# Patient Record
Sex: Male | Born: 1965
Health system: Southern US, Community
[De-identification: ages and names within clinical notes are randomized; demographics above are authoritative.]

## PROBLEM LIST (undated history)

## (undated) DIAGNOSIS — R7611 Nonspecific reaction to tuberculin skin test without active tuberculosis: Secondary | ICD-10-CM

## (undated) DIAGNOSIS — T783XXA Angioneurotic edema, initial encounter: Secondary | ICD-10-CM

## (undated) DIAGNOSIS — E119 Type 2 diabetes mellitus without complications: Secondary | ICD-10-CM

## (undated) DIAGNOSIS — L509 Urticaria, unspecified: Secondary | ICD-10-CM

## (undated) DIAGNOSIS — E785 Hyperlipidemia, unspecified: Secondary | ICD-10-CM

## (undated) DIAGNOSIS — K219 Gastro-esophageal reflux disease without esophagitis: Secondary | ICD-10-CM

## (undated) DIAGNOSIS — IMO0001 Reserved for inherently not codable concepts without codable children: Secondary | ICD-10-CM

## (undated) HISTORY — DX: Type 2 diabetes mellitus without complications: E11.9

## (undated) HISTORY — DX: Urticaria, unspecified: L50.9

## (undated) HISTORY — DX: Gastro-esophageal reflux disease without esophagitis: K21.9

## (undated) HISTORY — DX: Angioneurotic edema, initial encounter: T78.3XXA

## (undated) HISTORY — DX: Hyperlipidemia, unspecified: E78.5

## (undated) HISTORY — DX: Reserved for inherently not codable concepts without codable children: IMO0001

## (undated) HISTORY — PX: NO PAST SURGERIES: SHX2092

## (undated) HISTORY — DX: Nonspecific reaction to tuberculin skin test without active tuberculosis: R76.11

---

## 1997-07-02 DIAGNOSIS — R7611 Nonspecific reaction to tuberculin skin test without active tuberculosis: Secondary | ICD-10-CM

## 1997-07-02 HISTORY — DX: Nonspecific reaction to tuberculin skin test without active tuberculosis: R76.11

## 1999-07-03 DIAGNOSIS — Z111 Encounter for screening for respiratory tuberculosis: Secondary | ICD-10-CM

## 1999-07-03 DIAGNOSIS — IMO0001 Reserved for inherently not codable concepts without codable children: Secondary | ICD-10-CM

## 1999-07-03 HISTORY — DX: Reserved for inherently not codable concepts without codable children: IMO0001

## 1999-07-03 HISTORY — DX: Encounter for screening for respiratory tuberculosis: Z11.1

## 2000-03-08 ENCOUNTER — Encounter: Payer: Self-pay | Admitting: Family Medicine

## 2000-03-08 ENCOUNTER — Ambulatory Visit (HOSPITAL_COMMUNITY): Admission: RE | Admit: 2000-03-08 | Discharge: 2000-03-08 | Payer: Self-pay | Admitting: Family Medicine

## 2002-09-20 ENCOUNTER — Other Ambulatory Visit: Payer: Self-pay

## 2002-09-20 ENCOUNTER — Other Ambulatory Visit (HOSPITAL_BASED_OUTPATIENT_CLINIC_OR_DEPARTMENT_OTHER): Payer: Self-pay | Admitting: Anesthesiology

## 2002-09-21 ENCOUNTER — Other Ambulatory Visit: Payer: Self-pay

## 2002-09-21 ENCOUNTER — Other Ambulatory Visit: Payer: Self-pay | Admitting: Emergency Medicine

## 2002-09-21 ENCOUNTER — Other Ambulatory Visit: Payer: Self-pay | Admitting: Adult Reconstructive Orthopaedic Surgery

## 2005-10-20 ENCOUNTER — Emergency Department (HOSPITAL_COMMUNITY): Admission: EM | Admit: 2005-10-20 | Discharge: 2005-10-20 | Payer: Self-pay | Admitting: Emergency Medicine

## 2006-03-01 ENCOUNTER — Ambulatory Visit: Payer: Self-pay | Admitting: Internal Medicine

## 2007-02-14 ENCOUNTER — Ambulatory Visit: Payer: Self-pay | Admitting: Internal Medicine

## 2008-05-25 ENCOUNTER — Ambulatory Visit: Payer: Self-pay | Admitting: Internal Medicine

## 2008-05-25 DIAGNOSIS — K219 Gastro-esophageal reflux disease without esophagitis: Secondary | ICD-10-CM | POA: Insufficient documentation

## 2008-05-26 LAB — CONVERTED CEMR LAB
ALT: 24 units/L (ref 0–53)
AST: 21 units/L (ref 0–37)
BUN: 13 mg/dL (ref 6–23)
Basophils Absolute: 0.1 10*3/uL (ref 0.0–0.1)
Basophils Relative: 0.9 % (ref 0.0–3.0)
CO2: 30 meq/L (ref 19–32)
Calcium: 9.5 mg/dL (ref 8.4–10.5)
Chloride: 105 meq/L (ref 96–112)
Cholesterol: 159 mg/dL (ref 0–200)
Creatinine, Ser: 1 mg/dL (ref 0.4–1.5)
Eosinophils Absolute: 0.4 10*3/uL (ref 0.0–0.7)
Eosinophils Relative: 4.7 % (ref 0.0–5.0)
GFR calc Af Amer: 106 mL/min
GFR calc non Af Amer: 88 mL/min
Glucose, Bld: 94 mg/dL (ref 70–99)
HCT: 47.4 % (ref 39.0–52.0)
HDL: 36 mg/dL — ABNORMAL LOW (ref >39.0)
Hemoglobin: 16 g/dL (ref 13.0–17.0)
LDL Cholesterol: 92 mg/dL (ref 0–99)
Lymphocytes Relative: 29.9 % (ref 12.0–46.0)
MCHC: 33.8 g/dL (ref 30.0–36.0)
MCV: 85.8 fL (ref 78.0–100.0)
Monocytes Absolute: 0.6 10*3/uL (ref 0.1–1.0)
Monocytes Relative: 7.4 % (ref 3.0–12.0)
Neutro Abs: 4.4 10*3/uL (ref 1.4–7.7)
Neutrophils Relative %: 57.1 % (ref 43.0–77.0)
Platelets: 178 10*3/uL (ref 150–400)
Potassium: 4.2 meq/L (ref 3.5–5.1)
RBC: 5.52 M/uL (ref 4.22–5.81)
RDW: 13.4 % (ref 11.5–14.6)
Sodium: 140 meq/L (ref 135–145)
TSH: 3.36 microintl units/mL (ref 0.35–5.50)
Total CHOL/HDL Ratio: 4.4
Triglycerides: 154 mg/dL — ABNORMAL HIGH (ref 0–149)
VLDL: 31 mg/dL (ref 0–40)
WBC: 7.8 10*3/uL (ref 4.5–10.5)

## 2009-09-27 ENCOUNTER — Ambulatory Visit: Payer: Self-pay | Admitting: Internal Medicine

## 2009-09-27 DIAGNOSIS — D179 Benign lipomatous neoplasm, unspecified: Secondary | ICD-10-CM | POA: Insufficient documentation

## 2009-10-06 LAB — CONVERTED CEMR LAB
ALT: 22 units/L (ref 0–53)
AST: 20 units/L (ref 0–37)
Albumin: 4.2 g/dL (ref 3.5–5.2)
Alkaline Phosphatase: 64 units/L (ref 39–117)
BUN: 16 mg/dL (ref 6–23)
Basophils Absolute: 0 10*3/uL (ref 0.0–0.1)
Basophils Relative: 0.2 % (ref 0.0–3.0)
Bilirubin, Direct: 0 mg/dL (ref 0.0–0.3)
CO2: 30 meq/L (ref 19–32)
Calcium: 9.4 mg/dL (ref 8.4–10.5)
Chloride: 105 meq/L (ref 96–112)
Cholesterol: 160 mg/dL (ref 0–200)
Creatinine, Ser: 1.1 mg/dL (ref 0.4–1.5)
Eosinophils Absolute: 0.2 10*3/uL (ref 0.0–0.7)
Eosinophils Relative: 2.6 % (ref 0.0–5.0)
GFR calc non Af Amer: 77.97 mL/min (ref >60)
Glucose, Bld: 93 mg/dL (ref 70–99)
HCT: 46.9 % (ref 39.0–52.0)
HDL: 43.9 mg/dL (ref >39.00)
Hemoglobin: 15.5 g/dL (ref 13.0–17.0)
LDL Cholesterol: 95 mg/dL (ref 0–99)
Lymphocytes Relative: 31.4 % (ref 12.0–46.0)
Lymphs Abs: 2.4 10*3/uL (ref 0.7–4.0)
MCHC: 33.1 g/dL (ref 30.0–36.0)
MCV: 85.8 fL (ref 78.0–100.0)
Monocytes Absolute: 0.5 10*3/uL (ref 0.1–1.0)
Monocytes Relative: 7.1 % (ref 3.0–12.0)
Neutro Abs: 4.5 10*3/uL (ref 1.4–7.7)
Neutrophils Relative %: 58.7 % (ref 43.0–77.0)
Platelets: 182 10*3/uL (ref 150.0–400.0)
Potassium: 4.8 meq/L (ref 3.5–5.1)
RBC: 5.47 M/uL (ref 4.22–5.81)
RDW: 14.2 % (ref 11.5–14.6)
Sodium: 144 meq/L (ref 135–145)
TSH: 4.07 microintl units/mL (ref 0.35–5.50)
Total Bilirubin: 0.6 mg/dL (ref 0.3–1.2)
Total CHOL/HDL Ratio: 4
Total Protein: 8.3 g/dL (ref 6.0–8.3)
Triglycerides: 105 mg/dL (ref 0.0–149.0)
VLDL: 21 mg/dL (ref 0.0–40.0)
WBC: 7.6 10*3/uL (ref 4.5–10.5)

## 2010-08-01 NOTE — Assessment & Plan Note (Signed)
Summary: cpx- jr   Vital Signs:  Patient profile:   45 year old male Height:      69.25 inches Weight:      209 pounds BMI:     30.75 Pulse rate:   66 / minute BP sitting:   120 / 70  Vitals Entered By: Shary Decamp (September 27, 2009 8:54 AM)  History of Present Illness: CPX occasionally has right knee pain when she stands up, no swelling, redness.  Preventive Screening-Counseling & Management  Caffeine-Diet-Exercise     Caffeine use/day: 0     Does Patient Exercise: no      Drug Use:  no.    Current Medications (verified): 1)  Omeprazole 20 Mg Tbec (Omeprazole) .... Once Daily As Needed 2)  Zantac 75 75 Mg Tabs (Ranitidine Hcl) .... Alternates With Omeprazole  Allergies (verified): No Known Drug Allergies  Past History:  Past Medical History: +PPD 1999 INH x 6 months neg PPD 2001 GERD  Past Surgical History: Reviewed history from 05/25/2008 and no changes required. no  Family History: Reviewed history from 05/25/2008 and no changes required. DM - no colon Ca - no prostate Ca - no HTN - no CAD - no stroke - no  Social History: Married from [place] 1 adopted children tobacco--no ETOH--no exercise-- active at work diet-- healthy most of the time Caffeine use/day:  0 Drug Use:  no Does Patient Exercise:  no  Review of Systems CV:  Denies chest pain or discomfort and swelling of feet. Resp:  Denies cough and shortness of breath. GI:  Denies bloody stools, diarrhea, nausea, and vomiting; GERD-- uses zantac or prilosec as needed . GU:  Denies dysuria, hematuria, urinary frequency, and urinary hesitancy.  Physical Exam  General:  alert, well-developed, and overweight-appearing.   Neck:  no masses and no thyromegaly.   Lungs:  normal respiratory effort, no intercostal retractions, no accessory muscle use, and normal breath sounds.   Heart:  normal rate, regular rhythm, no murmur, and no gallop.   Abdomen:  soft, non-tender, no distention, no masses,  no hepatomegaly, and no splenomegaly.  at the left flank has a 1x 0.8-inch s.q. mass consistent with a lipoma Extremities:  no edema knees are symmetric, no swelling, redness, ligaments stable   Impression & Recommendations:  Problem # 1:  HEALTH SCREENING (ICD-V70.0)  Td 09 cont healthy life style , exercise more     Orders: Venipuncture (16109) TLB-BMP (Basic Metabolic Panel-BMET) (80048-METABOL) TLB-CBC Platelet - w/Differential (85025-CBCD) TLB-Hepatic/Liver Function Pnl (80076-HEPATIC) TLB-Lipid Panel (80061-LIPID) TLB-TSH (Thyroid Stimulating Hormone) (84443-TSH)  Problem # 2:  LIPOMAS, MULTIPLE (ICD-214.9) history of multiple lipomas, status post evaluation by surgery, was recommend observation versus excision.  Patient elected observation.  Problem # 3:  GERD (ICD-530.81) symptoms mostly related to spicy foods, patient managing  his symptoms well with omeprazole or Zantac as needed His updated medication list for this problem includes:    Omeprazole 20 Mg Tbec (Omeprazole) ..... Once daily as needed    Zantac 75 75 Mg Tabs (Ranitidine hcl) .Marland Kitchen... Alternates with omeprazole  Complete Medication List: 1)  Omeprazole 20 Mg Tbec (Omeprazole) .... Once daily as needed 2)  Zantac 75 75 Mg Tabs (Ranitidine hcl) .... Alternates with omeprazole  Patient Instructions: 1)  Please schedule a follow-up appointment in 1 year.    Risk Factors:  Tobacco use:  never Drug use:  no Caffeine use:  0 drinks per day Alcohol use:  no Exercise:  no    Preventive  Care Screening  Prior Values:    Last Tetanus Booster:  Tdap (05/25/2008)

## 2010-11-21 ENCOUNTER — Ambulatory Visit (INDEPENDENT_AMBULATORY_CARE_PROVIDER_SITE_OTHER): Payer: Managed Care, Other (non HMO) | Admitting: Internal Medicine

## 2010-11-21 ENCOUNTER — Encounter: Payer: Self-pay | Admitting: Internal Medicine

## 2010-11-21 DIAGNOSIS — K219 Gastro-esophageal reflux disease without esophagitis: Secondary | ICD-10-CM

## 2010-11-21 DIAGNOSIS — D179 Benign lipomatous neoplasm, unspecified: Secondary | ICD-10-CM

## 2010-11-21 DIAGNOSIS — Z0000 Encounter for general adult medical examination without abnormal findings: Secondary | ICD-10-CM

## 2010-11-21 LAB — CBC WITH DIFFERENTIAL/PLATELET
Basophils Absolute: 0 10*3/uL (ref 0.0–0.1)
Basophils Relative: 0.2 % (ref 0.0–3.0)
Eosinophils Absolute: 0.2 10*3/uL (ref 0.0–0.7)
Eosinophils Relative: 3.1 % (ref 0.0–5.0)
HCT: 48.9 % (ref 39.0–52.0)
Hemoglobin: 16.5 g/dL (ref 13.0–17.0)
Lymphocytes Relative: 30.3 % (ref 12.0–46.0)
Lymphs Abs: 2.2 10*3/uL (ref 0.7–4.0)
MCHC: 33.7 g/dL (ref 30.0–36.0)
MCV: 85.3 fl (ref 78.0–100.0)
Monocytes Absolute: 0.5 10*3/uL (ref 0.1–1.0)
Monocytes Relative: 7 % (ref 3.0–12.0)
Neutro Abs: 4.4 10*3/uL (ref 1.4–7.7)
Neutrophils Relative %: 59.4 % (ref 43.0–77.0)
Platelets: 193 10*3/uL (ref 150.0–400.0)
RBC: 5.73 Mil/uL (ref 4.22–5.81)
RDW: 14.3 % (ref 11.5–14.6)
WBC: 7.4 10*3/uL (ref 4.5–10.5)

## 2010-11-21 LAB — BASIC METABOLIC PANEL
BUN: 16 mg/dL (ref 6–23)
CO2: 30 mEq/L (ref 19–32)
Calcium: 9.8 mg/dL (ref 8.4–10.5)
Chloride: 104 mEq/L (ref 96–112)
Creatinine, Ser: 1 mg/dL (ref 0.4–1.5)
GFR: 82.73 mL/min (ref >60.00)
Glucose, Bld: 97 mg/dL (ref 70–99)
Potassium: 4.8 mEq/L (ref 3.5–5.1)
Sodium: 141 mEq/L (ref 135–145)

## 2010-11-21 LAB — LIPID PANEL
Cholesterol: 182 mg/dL (ref 0–200)
HDL: 49.5 mg/dL (ref >39.00)
LDL Cholesterol: 109 mg/dL — ABNORMAL HIGH (ref 0–99)
Total CHOL/HDL Ratio: 4
Triglycerides: 119 mg/dL (ref 0.0–149.0)
VLDL: 23.8 mg/dL (ref 0.0–40.0)

## 2010-11-21 NOTE — Assessment & Plan Note (Signed)
Was seen by surgery before, they rec observation. Reports no change in size of lipomas

## 2010-11-21 NOTE — Assessment & Plan Note (Addendum)
Update on shots Diet-exercise discussed. Occ low back pain: stretching recommended  Labs

## 2010-11-21 NOTE — Assessment & Plan Note (Signed)
Well controlled w/ meds prn

## 2010-11-21 NOTE — Progress Notes (Signed)
  Subjective:    Patient ID: Bryan Shaw, male    DOB: 07-Jul-1965, 45 y.o.   MRN: [id number]  HPI Complete physical exam, no major concerns.  Past Medical History  Diagnosis Date  . Positive PPD 1999    INH x 6 months  . PPD negative 2001  . GERD (gastroesophageal reflux disease)    No past surgical history on file.  Family History: DM - no colon Ca - no prostate Ca - no HTN - no CAD - no stroke - no  Social History: Married from [place] 1 adopted children tobacco--no ETOH--no exercise-- active at work, no routine exercise  diet-- healthy most of the time  Drug Use:  no    Review of Systems Denies chest pain or shortness of breath. GERD symptoms well controlled with either PPIs or Zantac as needed. Denies abdominal pain, blood in the stools, diarrhea. No dysuria or gross hematuria. Occasional has back pain, mild symptoms.    Objective:   Physical Exam  Constitutional: He is oriented to person, place, and time. He appears well-developed and well-nourished. No distress.  HENT:  Head: Normocephalic and atraumatic.  Eyes: No scleral icterus.  Neck: No thyromegaly present.  Cardiovascular: Normal rate, regular rhythm and normal heart sounds.   No murmur heard. Pulmonary/Chest: Effort normal and breath sounds normal. No respiratory distress. He has no wheezes. He has no rales.  Abdominal: Soft. Bowel sounds are normal. He exhibits no distension. There is no tenderness. There is no rebound and no guarding.  Musculoskeletal: He exhibits no edema.  Neurological: He is alert and oriented to person, place, and time.  Skin: Skin is warm and dry. He is not diaphoretic.  Psychiatric: He has a normal mood and affect. His behavior is normal. Judgment and thought content normal.       Assessment & Plan:

## 2010-11-23 ENCOUNTER — Telehealth: Payer: Self-pay | Admitting: *Deleted

## 2010-11-23 NOTE — Telephone Encounter (Signed)
Message copied by Army Fossa on Thu Nov 23, 2010  8:55 AM ------      Message from: Bryan Shaw      Created: Wed Nov 22, 2010  5:13 PM       Advise patient,      All labs normal, and good cholesterol levels !

## 2010-11-23 NOTE — Telephone Encounter (Signed)
Message left for patient to return my call.  

## 2010-11-24 NOTE — Telephone Encounter (Signed)
Message left for patient to return my call.  

## 2010-11-28 NOTE — Telephone Encounter (Signed)
Message left for patient to return my call.  

## 2010-11-29 NOTE — Telephone Encounter (Signed)
Pt is aware.  

## 2010-11-29 NOTE — Telephone Encounter (Signed)
Please call patient on his cell at [phone]

## 2012-03-27 ENCOUNTER — Ambulatory Visit (INDEPENDENT_AMBULATORY_CARE_PROVIDER_SITE_OTHER): Payer: Managed Care, Other (non HMO) | Admitting: Internal Medicine

## 2012-03-27 ENCOUNTER — Encounter: Payer: Self-pay | Admitting: Internal Medicine

## 2012-03-27 VITALS — BP 142/86 | HR 54 | Temp 98.0°F | Ht 68.5 in | Wt 211.0 lb

## 2012-03-27 DIAGNOSIS — Z0000 Encounter for general adult medical examination without abnormal findings: Secondary | ICD-10-CM

## 2012-03-27 DIAGNOSIS — Z23 Encounter for immunization: Secondary | ICD-10-CM

## 2012-03-27 DIAGNOSIS — K219 Gastro-esophageal reflux disease without esophagitis: Secondary | ICD-10-CM

## 2012-03-27 LAB — HEPATIC FUNCTION PANEL
AST: 24 U/L (ref 0–37)
Total Bilirubin: 0.5 mg/dL (ref 0.3–1.2)

## 2012-03-27 LAB — TSH: TSH: 2.2 u[IU]/mL (ref 0.35–5.50)

## 2012-03-27 LAB — LIPID PANEL
Total CHOL/HDL Ratio: 4
VLDL: 23.4 mg/dL (ref 0.0–40.0)

## 2012-03-27 NOTE — Assessment & Plan Note (Addendum)
Td 2009 Flu shot today EKG  sinus bradycardia otherwise normal Diet- discussed.  Is exercising twice a week,praised   Labs

## 2012-03-27 NOTE — Progress Notes (Signed)
  Subjective:    Patient ID: Bryan Shaw, male    DOB: [birth year], 46 y.o.   MRN: [id number]  HPI CPX  Past medical history Positive PPD, 1999, status post antibiotics Negative PPD 2001 GERD  Past surgical history Tooth extractions  Family History: DM - sister (borderline) colon Ca - no prostate Ca - no HTN - no CAD - no stroke - no  Social History: Married, from [place], 1 adopted children tobacco--no ETOH--no exercise-- active at work, goes to the [employer]-- regular   Drug Use:  no    Review of Systems Currently, finishing a round of penicillin prescribed by his dentist. 3 days history of nasal congestion, denies fever chills or sputum production. He does have mild cough. No chest pain shortness or breath No palpitations No nausea, vomiting, diarrhea or blood in the stools. No dysuria, gross hematuria and difficulty urinating     Objective:   Physical Exam General -- alert, well-developed, and well-nourished.   Neck --no thyromegaly , normal carotid pulse Lungs -- normal respiratory effort, no intercostal retractions, no accessory muscle use, and normal breath sounds.   Heart-- normal rate, regular rhythm, no murmur, and no gallop.   Abdomen--soft, non-tender, no distention, no masses, no HSM, no guarding, and no rigidity.   Extremities-- no pretibial edema bilaterally Neurologic-- alert & oriented X3 and strength normal in all extremities. Psych-- Cognition and judgment appear intact. Alert and cooperative with normal attention span and concentration.  not anxious appearing and not depressed appearing.       Assessment & Plan:

## 2012-03-27 NOTE — Assessment & Plan Note (Signed)
Needs  PPIs only occasionally, has changed his diet (less spicy food), doing well

## 2012-04-01 ENCOUNTER — Encounter: Payer: Self-pay | Admitting: *Deleted

## 2012-11-20 ENCOUNTER — Ambulatory Visit: Payer: Managed Care, Other (non HMO)

## 2012-11-20 ENCOUNTER — Ambulatory Visit (INDEPENDENT_AMBULATORY_CARE_PROVIDER_SITE_OTHER): Payer: Managed Care, Other (non HMO) | Admitting: Family Medicine

## 2012-11-20 VITALS — BP 134/85 | HR 62 | Temp 98.3°F | Resp 16 | Ht 69.5 in | Wt 212.4 lb

## 2012-11-20 DIAGNOSIS — R0989 Other specified symptoms and signs involving the circulatory and respiratory systems: Secondary | ICD-10-CM

## 2012-11-20 DIAGNOSIS — R1310 Dysphagia, unspecified: Secondary | ICD-10-CM

## 2012-11-20 DIAGNOSIS — R198 Other specified symptoms and signs involving the digestive system and abdomen: Secondary | ICD-10-CM

## 2012-11-20 DIAGNOSIS — Z1329 Encounter for screening for other suspected endocrine disorder: Secondary | ICD-10-CM

## 2012-11-20 DIAGNOSIS — R09A2 Foreign body sensation, throat: Secondary | ICD-10-CM

## 2012-11-20 DIAGNOSIS — F449 Dissociative and conversion disorder, unspecified: Secondary | ICD-10-CM

## 2012-11-20 NOTE — Progress Notes (Signed)
  Subjective:    Patient ID: Bryan Shaw, male    DOB: 04-06-1966, 47 y.o.   MRN: [id number]  HPI 47 year old pleasant male presents with 4 day history of "feeling like there is a lump in his throat." Symptoms started suddenly 4 days ago after eating soup and rice for dinner.  Since then every time he swallows he has a slight discomfort and full sensation in his throat. Does not have any upper sore throat or pain in his oropharynx.  Is concerned about his thyroid.  Denies any hair/nail changes, unexplained weight gain or loss, skin changes, fever, chills, nausea, or vomiting. He overall feels well. Had a CPE last year. No history of cardiac disease. Does have hx of GERD treated with Zantac 150 mg prn which does seem to help.  Admits he has a lot of peppers and spicy food in his diet.  Does not admit to any increase in GERD symptoms prior to onset of this sensation.      Review of Systems  Constitutional: Negative for fever, chills, activity change, appetite change and unexpected weight change.  HENT: Negative for sore throat, trouble swallowing and neck pain.   Cardiovascular: Negative for chest pain.  Gastrointestinal: Negative for nausea and vomiting.  Endocrine: Negative for cold intolerance and heat intolerance.       Objective:   Physical Exam  Constitutional: He is oriented to person, place, and time. He appears well-developed and well-nourished.  HENT:  Head: Normocephalic and atraumatic.  Right Ear: Hearing, tympanic membrane, external ear and ear canal normal.  Left Ear: Hearing, tympanic membrane, external ear and ear canal normal.  Mouth/Throat: Uvula is midline, oropharynx is clear and moist and mucous membranes are normal.  Eyes: Conjunctivae are normal.  Neck: Normal range of motion. No thyromegaly present.  Cardiovascular: Normal rate, regular rhythm and normal heart sounds.   Pulmonary/Chest: Effort normal and breath sounds normal.  Lymphadenopathy:    He has no  cervical adenopathy.  Neurological: He is alert and oriented to person, place, and time.  Psychiatric: He has a normal mood and affect. His behavior is normal. Judgment and thought content normal.     [facility] reading (PRIMARY) by  Dr. Neva Seat as normal soft tissue neck.       Assessment & Plan:  Odynophagia - Plan: DG Neck Soft Tissue  Globus pharyngeus - Plan: DG Neck Soft Tissue  Screening for thyroid disorder - Plan: TSH  Labs pending Normal soft tissue neck today Recommend start Zantac 150 mg daily x 1 week Maalox qhs x 3-4 days Let me know if symptoms worsen or fail to improve. If symptoms do not seem to be getting better over the next 3-4 days, consider ENT referral for further evaluation and treatment.

## 2012-11-20 NOTE — Progress Notes (Signed)
Xray read and patient discussed with Ms Renato Gails. Agree with assessment and plan of care per her note.  Radiology report reviewed:  *RADIOLOGY REPORT*  Clinical Data: Odynophagia  NECK SOFT TISSUES - 1+ VIEW  Comparison: None.  Findings: Normal lateral neck soft tissues.  No prevertebral soft tissue swelling.  Mild degenerative changes of the cervical spine.  IMPRESSION:  Unremarkable lateral soft tissue neck.

## 2012-11-21 LAB — TSH: TSH: 3.38 u[IU]/mL (ref 0.350–4.500)

## 2013-04-01 ENCOUNTER — Telehealth: Payer: Self-pay

## 2013-04-01 NOTE — Telephone Encounter (Signed)
LM for CB HM not UTD-needs flu vaccine Any information from previous providers?

## 2013-04-02 ENCOUNTER — Ambulatory Visit (INDEPENDENT_AMBULATORY_CARE_PROVIDER_SITE_OTHER): Payer: Managed Care, Other (non HMO) | Admitting: Internal Medicine

## 2013-04-02 ENCOUNTER — Encounter: Payer: Self-pay | Admitting: Internal Medicine

## 2013-04-02 VITALS — BP 129/82 | HR 64 | Temp 98.5°F | Ht 70.2 in | Wt 217.6 lb

## 2013-04-02 DIAGNOSIS — K219 Gastro-esophageal reflux disease without esophagitis: Secondary | ICD-10-CM

## 2013-04-02 DIAGNOSIS — Z23 Encounter for immunization: Secondary | ICD-10-CM

## 2013-04-02 DIAGNOSIS — Z0000 Encounter for general adult medical examination without abnormal findings: Secondary | ICD-10-CM

## 2013-04-02 LAB — LIPID PANEL
Cholesterol: 174 mg/dL (ref 0–200)
HDL: 42.9 mg/dL (ref >39.00)
VLDL: 28.4 mg/dL (ref 0.0–40.0)

## 2013-04-02 LAB — COMPREHENSIVE METABOLIC PANEL
ALT: 32 U/L (ref 0–53)
Albumin: 4.3 g/dL (ref 3.5–5.2)
CO2: 26 mEq/L (ref 19–32)
Calcium: 9.1 mg/dL (ref 8.4–10.5)
Chloride: 105 mEq/L (ref 96–112)
Creatinine, Ser: 1.1 mg/dL (ref 0.4–1.5)
GFR: 78.35 mL/min (ref >60.00)
Potassium: 4.2 mEq/L (ref 3.5–5.1)
Sodium: 137 mEq/L (ref 135–145)
Total Protein: 7.6 g/dL (ref 6.0–8.3)

## 2013-04-02 LAB — CBC WITH DIFFERENTIAL/PLATELET
Basophils Absolute: 0 10*3/uL (ref 0.0–0.1)
Eosinophils Absolute: 0.3 10*3/uL (ref 0.0–0.7)
Lymphocytes Relative: 30.5 % (ref 12.0–46.0)
Monocytes Relative: 7.7 % (ref 3.0–12.0)
Neutrophils Relative %: 58.1 % (ref 43.0–77.0)
Platelets: 199 10*3/uL (ref 150.0–400.0)
RDW: 14.6 % (ref 11.5–14.6)

## 2013-04-02 NOTE — Assessment & Plan Note (Signed)
Well-controlled with occasional use of omeprazole or Zantac

## 2013-04-02 NOTE — Telephone Encounter (Signed)
Unable to reach Pre-Visit.   

## 2013-04-02 NOTE — Assessment & Plan Note (Addendum)
Td 09 flu shot today Has gain some weight but feels well Diet-exercise discussed  Labs

## 2013-04-02 NOTE — Progress Notes (Signed)
  Subjective:    Patient ID: Bryan Shaw, male    DOB: 07-16-1965, 47 y.o.   MRN: [id number]  HPI CPX  Past Medical History  Diagnosis Date  . Positive PPD 1999    INH x 6 months  . PPD negative 2001  . GERD (gastroesophageal reflux disease)    Past Surgical History  Procedure Laterality Date  . No past surgeries     History   Social History  . Marital Status: Married    Spouse Name: N/A    Number of Children: 1  . Years of Education: N/A   Occupational History  . furniture sales    Social History Main Topics  . Smoking status: Never Smoker   . Smokeless tobacco: Never Used  . Alcohol Use: No  . Drug Use: No  . Sexual Activity: Yes    Birth Control/ Protection: None   Other Topics Concern  . Not on file   Social History Narrative   From [place]   1 adopted children       Family History  Problem Relation Age of Onset  . Diabetes Neg Hx   . Colon cancer Neg Hx   . Hypertension Neg Hx   . Coronary artery disease Neg Hx   . Stroke Neg Hx     Review of Systems Diet--  Regular, healthy Exercise-- gym x 2-3/week No  CP, SOB, lower extremity edema Denies  nausea, vomiting diarrhea Denies  blood in the stools (-) cough, sputum production No dysuria, gross hematuria, difficulty urinating   No anxiety, depression    Objective:   Physical Exam BP 129/82  Pulse 64  Temp(Src) 98.5 F (36.9 C)  Ht 5' 10.2" (1.783 m)  Wt 217 lb 9.6 oz (98.703 kg)  BMI 31.05 kg/m2  SpO2 100% General -- alert, well-developed, NAD.  Neck --no thyromegaly Lungs -- normal respiratory effort, no intercostal retractions, no accessory muscle use, and normal breath sounds.  Heart-- normal rate, regular rhythm, no murmur.  Abdomen-- Not distended, good bowel sounds,soft, non-tender. Extremities-- no pretibial edema bilaterally  Neurologic--  alert & oriented X3. Speech normal, gait normal, strength normal in all extremities.   Psych-- Cognition and judgment appear intact.  Cooperative with normal attention span and concentration. No anxious appearing , no depressed appearing.      Assessment & Plan:

## 2013-04-06 ENCOUNTER — Encounter: Payer: Self-pay | Admitting: *Deleted

## 2014-06-03 ENCOUNTER — Encounter: Payer: Self-pay | Admitting: Internal Medicine

## 2014-06-09 ENCOUNTER — Encounter: Payer: Self-pay | Admitting: Internal Medicine

## 2014-06-09 ENCOUNTER — Ambulatory Visit (INDEPENDENT_AMBULATORY_CARE_PROVIDER_SITE_OTHER): Payer: 59 | Admitting: Internal Medicine

## 2014-06-09 ENCOUNTER — Ambulatory Visit (INDEPENDENT_AMBULATORY_CARE_PROVIDER_SITE_OTHER): Payer: 59

## 2014-06-09 VITALS — BP 128/72 | HR 61 | Temp 98.2°F | Ht 70.0 in | Wt 209.5 lb

## 2014-06-09 DIAGNOSIS — Z23 Encounter for immunization: Secondary | ICD-10-CM

## 2014-06-09 DIAGNOSIS — K219 Gastro-esophageal reflux disease without esophagitis: Secondary | ICD-10-CM

## 2014-06-09 DIAGNOSIS — Z0000 Encounter for general adult medical examination without abnormal findings: Secondary | ICD-10-CM

## 2014-06-09 LAB — LIPID PANEL
Cholesterol: 151 mg/dL (ref 0–200)
HDL: 38.3 mg/dL — ABNORMAL LOW (ref >39.00)
LDL CALC: 81 mg/dL (ref 0–99)
NONHDL: 112.7
Total CHOL/HDL Ratio: 4
Triglycerides: 157 mg/dL — ABNORMAL HIGH (ref 0.0–149.0)
VLDL: 31.4 mg/dL (ref 0.0–40.0)

## 2014-06-09 LAB — BASIC METABOLIC PANEL
BUN: 15 mg/dL (ref 6–23)
CALCIUM: 9.5 mg/dL (ref 8.4–10.5)
CO2: 31 mEq/L (ref 19–32)
CREATININE: 1.3 mg/dL (ref 0.4–1.5)
Chloride: 102 mEq/L (ref 96–112)
GFR: 65.85 mL/min (ref >60.00)
GLUCOSE: 97 mg/dL (ref 70–99)
Potassium: 4.8 mEq/L (ref 3.5–5.1)
Sodium: 138 mEq/L (ref 135–145)

## 2014-06-09 LAB — AST: AST: 23 U/L (ref 0–37)

## 2014-06-09 LAB — ALT: ALT: 24 U/L (ref 0–53)

## 2014-06-09 NOTE — Progress Notes (Signed)
   Subjective:    Patient ID: Bryan Shaw, male    DOB: [birth year], 48 y.o.   MRN: [id number]  DOS:  06/09/2014 Type of visit - description : cpx Interval history: In general feels well, GERD symptoms well controlled on Zantac as needed Developed URI a week ago, mostly dry cough. Denies fever, chills, sinus or chest congestion. BP slightly elevated today, see assessment and plan  ROS  Denies chest pain or difficulty breathing. No lower extremity edema No nausea, vomiting, diarrhea No anxiety or depression No headache or dizziness  Past Medical History  Diagnosis Date  . Positive PPD 1999    INH x 6 months  . PPD negative 2001  . GERD (gastroesophageal reflux disease)     Past Surgical History  Procedure Laterality Date  . No past surgeries      History   Social History  . Marital Status: Married    Spouse Name: N/A    Number of Children: 1  . Years of Education: N/A   Occupational History  . furniture sales    Social History Main Topics  . Smoking status: Never Smoker   . Smokeless tobacco: Never Used  . Alcohol Use: No  . Drug Use: No  . Sexual Activity: Yes    Birth Control/ Protection: None   Other Topics Concern  . Not on file   Social History Narrative   From [place]   1 adopted children         Family History  Problem Relation Age of Onset  . Diabetes Neg Hx   . Colon cancer Neg Hx   . Hypertension Neg Hx   . Coronary artery disease Neg Hx   . Stroke Neg Hx       Medication List       This list is accurate as of: 06/09/14  6:05 PM.  Always use your most recent med list.               ranitidine 150 MG tablet  Commonly known as:  ZANTAC  Take 150 mg by mouth as needed for heartburn.           Objective:   Physical Exam BP 128/72 mmHg  Pulse 61  Temp(Src) 98.2 F (36.8 C) (Oral)  Ht 5\' 10"  (1.778 m)  Wt 209 lb 8 oz (95.029 kg)  BMI 30.06 kg/m2  SpO2 96%  General -- alert, well-developed, NAD.  Neck --no  thyromegaly , normal carotid pulse  HEENT-- Not pale.  Throat symmetric, no redness or discharge. Face symmetric, sinuses not tender to palpation. Nose slt congested. Lungs -- normal respiratory effort, no intercostal retractions, no accessory muscle use, and normal breath sounds.  Heart-- normal rate, regular rhythm, no murmur.  Abdomen-- Not distended, good bowel sounds,soft, non-tender. Extremities-- no pretibial edema bilaterally  Neurologic--  alert & oriented X3. Speech normal, gait appropriate for age, strength symmetric and appropriate for age.  Psych-- Cognition and judgment appear intact. Cooperative with normal attention span and concentration. No anxious or depressed appearing.     Assessment & Plan:    Elevated BP, BP today elevated, also had screening @ work 2 months ago and BP was also slightly elevated. We rechecked it today and came down to 128/72 Nevertheless recommend low-salt diet, monitor BPs, will call if problems. See instructions URI Symptoms consistent with URI but no fever.see instructions

## 2014-06-09 NOTE — Assessment & Plan Note (Signed)
GERD Self discontinue PPIs, currently symptoms well-controlled on Zantac as needed

## 2014-06-09 NOTE — Patient Instructions (Addendum)
Get your blood work before you leave   Check the  blood pressure 2 or 3 times a month   Be sure your blood pressure is between  145/85  and 110/65.  if it is consistently higher or lower, let me know  Low salt diet   Rest, fluids , tylenol If  cough, take Mucinex DM twice a day as needed  If nasal  congestion use OTC Nasocort or Flonase : 2 nasal sprays on each side of the nose daily until you feel better Call if not gradually better over the next  10 days    Next visit 1 year      Low-Sodium Eating Plan Sodium raises blood pressure and causes water to be held in the body. Getting less sodium from food will help lower your blood pressure, reduce any swelling, and protect your heart, liver, and kidneys. We get sodium by adding salt (sodium chloride) to food. Most of our sodium comes from canned, boxed, and frozen foods. Restaurant foods, fast foods, and pizza are also very high in sodium. Even if you take medicine to lower your blood pressure or to reduce fluid in your body, getting less sodium from your food is important. WHAT IS MY PLAN? Most people should limit their sodium intake to 2,300 mg a day. Your health care provider recommends that you limit your sodium intake to __________ a day.  WHAT DO I NEED TO KNOW ABOUT THIS EATING PLAN? For the low-sodium eating plan, you will follow these general guidelines:  Choose foods with a % Daily Value for sodium of less than 5% (as listed on the food label).   Use salt-free seasonings or herbs instead of table salt or sea salt.   Check with your health care provider or pharmacist before using salt substitutes.   Eat fresh foods.  Eat more vegetables and fruits.  Limit canned vegetables. If you do use them, rinse them well to decrease the sodium.   Limit cheese to 1 oz (28 g) per day.   Eat lower-sodium products, often labeled as "lower sodium" or "no salt added."  Avoid foods that contain monosodium glutamate (MSG). MSG is  sometimes added to [place] food and some canned foods.  Check food labels (Nutrition Facts labels) on foods to learn how much sodium is in one serving.  Eat more home-cooked food and less restaurant, buffet, and fast food.  When eating at a restaurant, ask that your food be prepared with less salt or none, if possible.  HOW DO I READ FOOD LABELS FOR SODIUM INFORMATION? The Nutrition Facts label lists the amount of sodium in one serving of the food. If you eat more than one serving, you must multiply the listed amount of sodium by the number of servings. Food labels may also identify foods as:  Sodium free--Less than 5 mg in a serving.  Very low sodium--35 mg or less in a serving.  Low sodium--140 mg or less in a serving.  Light in sodium--50% less sodium in a serving. For example, if a food that usually has 300 mg of sodium is changed to become light in sodium, it will have 150 mg of sodium.  Reduced sodium--25% less sodium in a serving. For example, if a food that usually has 400 mg of sodium is changed to reduced sodium, it will have 300 mg of sodium. WHAT FOODS CAN I EAT? Grains Low-sodium cereals, including oats, puffed wheat and rice, and shredded wheat cereals. Low-sodium crackers. Unsalted rice  and pasta. Lower-sodium bread.  Vegetables Frozen or fresh vegetables. Low-sodium or reduced-sodium canned vegetables. Low-sodium or reduced-sodium tomato sauce and paste. Low-sodium or reduced-sodium tomato and vegetable juices.  Fruits Fresh, frozen, and canned fruit. Fruit juice.  Meat and Other Protein Products Low-sodium canned tuna and salmon. Fresh or frozen meat, poultry, seafood, and fish. Lamb. Unsalted nuts. Dried beans, peas, and lentils without added salt. Unsalted canned beans. Homemade soups without salt. Eggs.  Dairy Milk. Soy milk. Ricotta cheese. Low-sodium or reduced-sodium cheeses. Yogurt.  Condiments Fresh and dried herbs and spices. Salt-free seasonings.  Onion and garlic powders. Low-sodium varieties of mustard and ketchup. Lemon juice.  Fats and Oils Reduced-sodium salad dressings. Unsalted butter.  Other Unsalted popcorn and pretzels.  The items listed above may not be a complete list of recommended foods or beverages. Contact your dietitian for more options. WHAT FOODS ARE NOT RECOMMENDED? Grains Instant hot cereals. Bread stuffing, pancake, and biscuit mixes. Croutons. Seasoned rice or pasta mixes. Noodle soup cups. Boxed or frozen macaroni and cheese. Self-rising flour. Regular salted crackers. Vegetables Regular canned vegetables. Regular canned tomato sauce and paste. Regular tomato and vegetable juices. Frozen vegetables in sauces. Salted french fries. Olives. Angie Fava. Relishes. Sauerkraut. Salsa. Meat and Other Protein Products Salted, canned, smoked, spiced, or pickled meats, seafood, or fish. Bacon, ham, sausage, hot dogs, corned beef, chipped beef, and packaged luncheon meats. Salt pork. Jerky. Pickled herring. Anchovies, regular canned tuna, and sardines. Salted nuts. Dairy Processed cheese and cheese spreads. Cheese curds. Blue cheese and cottage cheese. Buttermilk.  Condiments Onion and garlic salt, seasoned salt, table salt, and sea salt. Canned and packaged gravies. Worcestershire sauce. Tartar sauce. Barbecue sauce. Teriyaki sauce. Soy sauce, including reduced sodium. Steak sauce. Fish sauce. Oyster sauce. Cocktail sauce. Horseradish. Regular ketchup and mustard. Meat flavorings and tenderizers. Bouillon cubes. Hot sauce. Tabasco sauce. Marinades. Taco seasonings. Relishes. Fats and Oils Regular salad dressings. Salted butter. Margarine. Ghee. Bacon fat.  Other Potato and tortilla chips. Corn chips and puffs. Salted popcorn and pretzels. Canned or dried soups. Pizza. Frozen entrees and pot pies.  The items listed above may not be a complete list of foods and beverages to avoid. Contact your dietitian for more  information. Document Released: 12/08/2001 Document Revised: 06/23/2013 Document Reviewed: 04/22/2013 [facility] Patient Information 2015 [place], [place]. This information is not intended to replace advice given to you by your health care provider. Make sure you discuss any questions you have with your health care provider.

## 2014-06-09 NOTE — Progress Notes (Signed)
Pre visit review using our clinic review tool, if applicable. No additional management support is needed unless otherwise documented below in the visit note. 

## 2014-06-09 NOTE — Assessment & Plan Note (Addendum)
Td 09 flu shot today Diet-exercise discussed , particularly low-salt diet to help BP  Labs

## 2014-09-20 ENCOUNTER — Ambulatory Visit (INDEPENDENT_AMBULATORY_CARE_PROVIDER_SITE_OTHER): Payer: 59

## 2014-09-20 ENCOUNTER — Ambulatory Visit (INDEPENDENT_AMBULATORY_CARE_PROVIDER_SITE_OTHER): Payer: 59 | Admitting: Emergency Medicine

## 2014-09-20 VITALS — BP 110/60 | HR 74 | Temp 99.1°F | Resp 16 | Ht 69.0 in | Wt 207.0 lb

## 2014-09-20 DIAGNOSIS — R059 Cough, unspecified: Secondary | ICD-10-CM

## 2014-09-20 DIAGNOSIS — J029 Acute pharyngitis, unspecified: Secondary | ICD-10-CM

## 2014-09-20 DIAGNOSIS — M791 Myalgia, unspecified site: Secondary | ICD-10-CM

## 2014-09-20 DIAGNOSIS — R05 Cough: Secondary | ICD-10-CM | POA: Diagnosis not present

## 2014-09-20 LAB — POCT INFLUENZA A/B
INFLUENZA A, POC: NEGATIVE
INFLUENZA B, POC: NEGATIVE

## 2014-09-20 LAB — POCT CBC
GRANULOCYTE PERCENT: 81.1 % — AB (ref 37–80)
HEMATOCRIT: 50.2 % (ref 43.5–53.7)
Hemoglobin: 16.2 g/dL (ref 14.1–18.1)
Lymph, poc: 1.5 (ref 0.6–3.4)
MCH, POC: 28 pg (ref 27–31.2)
MCHC: 32.3 g/dL (ref 31.8–35.4)
MCV: 87 fL (ref 80–97)
MID (cbc): 0.3 (ref 0–0.9)
MPV: 8.8 fL (ref 0–99.8)
POC GRANULOCYTE: 7.7 — AB (ref 2–6.9)
POC LYMPH %: 15.5 % (ref 10–50)
POC MID %: 3.4 %M (ref 0–12)
Platelet Count, POC: 205 10*3/uL (ref 142–424)
RBC: 5.77 M/uL (ref 4.69–6.13)
RDW, POC: 14.1 %
WBC: 9.5 10*3/uL (ref 4.6–10.2)

## 2014-09-20 LAB — POCT RAPID STREP A (OFFICE): RAPID STREP A SCREEN: NEGATIVE

## 2014-09-20 MED ORDER — DOXYCYCLINE HYCLATE 100 MG PO CAPS: 100.0000 mg | ORAL_CAPSULE | Freq: Two times a day (BID) | ORAL | Status: DC

## 2014-09-20 MED ORDER — OSELTAMIVIR PHOSPHATE 75 MG PO CAPS: 75.0000 mg | ORAL_CAPSULE | Freq: Two times a day (BID) | ORAL | Status: DC

## 2014-09-20 NOTE — Progress Notes (Signed)
Assuming care of this patient.  He received a 1.5 liters of normal saline, tolerated this without complaint. Orthostatics as follows.  Orthostatic VS for the past 24 hrs:  BP- Lying Pulse- Lying BP- Sitting Pulse- Sitting BP- Standing at 0 minutes Pulse- Standing at 0 minutes  09/20/14 1558 123/76 mmHg 65 128/72 mmHg 73 122/71 mmHg 73   [facility] reading (PRIMARY) by  Dr. Everlene Farrier: Xray suspicious for left lower lobe infiltrate.   Patient was discharge with medications and instructions per Dr. Perfecto Kingdom note.    Philis Fendt, MS, PA-C   4:45 PM, 09/20/2014

## 2014-09-20 NOTE — Progress Notes (Addendum)
Subjective:  This chart was scribed for Bryan Jordan MD, by Tamsen Roers, at [facility].  This patient was seen in room 12 and the patient's care was started at 12:42 PM.    Patient ID: Bryan Shaw, male    DOB: 11/05/1965, 49 y.o.   MRN: [id number]  HPI  HPI Comments: Bryan Shaw is a 49 y.o. male who presents to [facility] here for multiple complaints including a cough, sneezing, headache, myalgias, fever, rhinorrhea onset two days ago.  Patient notes that his son at home was sick prior to his illness.  He is also here with his wife who has similar symptoms.  Patient denies any severe medical conditions. Patient is from [place].  Patient has no other complaints today.     Patient Active Problem List   Diagnosis Date Noted  . Annual physical exam 11/21/2010  . LIPOMAS, MULTIPLE 09/27/2009  . GERD 05/25/2008   Past Medical History  Diagnosis Date  . Positive PPD 1999    INH x 6 months  . PPD negative 2001  . GERD (gastroesophageal reflux disease)    Past Surgical History  Procedure Laterality Date  . No past surgeries     No Known Allergies Prior to Admission medications   Medication Sig Start Date End Date Taking? Authorizing Provider  ranitidine (ZANTAC) 150 MG tablet Take 150 mg by mouth as needed for heartburn.    Historical Provider, MD   History   Social History  . Marital Status: Married    Spouse Name: N/A  . Number of Children: 1  . Years of Education: N/A   Occupational History  . furniture sales    Social History Main Topics  . Smoking status: Never Smoker   . Smokeless tobacco: Never Used  . Alcohol Use: No  . Drug Use: No  . Sexual Activity: Yes    Birth Control/ Protection: None   Other Topics Concern  . Not on file   Social History Narrative   From [place]   1 adopted children         Review of Systems  Constitutional: Positive for fever, chills and fatigue.  HENT: Positive for  rhinorrhea. Negative for drooling.   Eyes: Negative for itching.  Respiratory: Positive for cough.   Musculoskeletal: Positive for myalgias.  Neurological: Positive for headaches. Negative for syncope and facial asymmetry.       Objective:   Physical Exam CONSTITUTIONAL: Well developed/well nourished HEAD: Normocephalic/atraumatic EYES: EOMI/PERRL ENMT: significant nasal drainage. he has sputum production with greenish yellow phlegm. NECK: supple no meningeal signs SPINE/BACK:entire spine nontender CV: S1/S2 noted, no murmurs/rubs/gallops noted LUNGS: Lungs are clear to auscultation bilaterally, no apparent distress ABDOMEN: soft, nontender, no rebound or guarding, bowel sounds noted throughout abdomen GU:no cva tenderness NEURO: Pt is awake/alert/appropriate, moves all extremitiesx4.  No facial droop.   EXTREMITIES: pulses normal/equal, full ROM SKIN: warm, color normal PSYCH: no abnormalities of mood noted, alert and oriented to situation    Filed Vitals:   09/20/14 1215  BP: 100/72  Pulse: 74  Temp: 99.1 F (37.3 C)  TempSrc: Oral  Resp: 16  Height: 5\' 9"  (1.753 m)  Weight: 207 lb (93.895 kg)  SpO2: 96%   Results for orders placed or performed in visit on 09/20/14  POCT CBC  Result Value Ref Range   WBC 9.5 4.6 - 10.2 K/uL   Lymph, poc 1.5 0.6 - 3.4   POC LYMPH PERCENT 15.5  10 - 50 %L   MID (cbc) 0.3 0 - 0.9   POC MID % 3.4 0 - 12 %M   POC Granulocyte 7.7 (A) 2 - 6.9   Granulocyte percent 81.1 (A) 37 - 80 %G   RBC 5.77 4.69 - 6.13 M/uL   Hemoglobin 16.2 14.1 - 18.1 g/dL   HCT, POC 50.2 43.5 - 53.7 %   MCV 87.0 80 - 97 fL   MCH, POC 28.0 27 - 31.2 pg   MCHC 32.3 31.8 - 35.4 g/dL   RDW, POC 14.1 %   Platelet Count, POC 205 142 - 424 K/uL   MPV 8.8 0 - 99.8 fL  POCT Influenza A/B  Result Value Ref Range   Influenza A, POC Negative    Influenza B, POC Negative   POCT rapid strep A  Result Value Ref Range   Rapid Strep A Screen Negative Negative         Assessment & Plan:  Chest x-ray has a suspicious left lower lobe infiltrate. His white count would say this is flu. His wife tested positive for flu but he did not. He does have a productive cough of the greenish phlegm. Will treat with Tamiflu and doxycycline. Pressures were low 100/72 and 96/70. Will give a liter and a half of IV fluids and recheck vitals. He was also given 2 Tylenol.I personally performed the services described in this documentation, which was scribed in my presence. The recorded information has been reviewed and is accurate.

## 2015-08-24 ENCOUNTER — Encounter: Payer: Self-pay | Admitting: Behavioral Health

## 2015-08-24 ENCOUNTER — Telehealth: Payer: Self-pay | Admitting: Behavioral Health

## 2015-08-24 NOTE — Telephone Encounter (Signed)
Pre-Visit Call completed with patient and chart updated.   Pre-Visit Info documented in Specialty Comments under [employer].    

## 2015-08-25 ENCOUNTER — Ambulatory Visit (INDEPENDENT_AMBULATORY_CARE_PROVIDER_SITE_OTHER): Payer: 59 | Admitting: Internal Medicine

## 2015-08-25 ENCOUNTER — Encounter: Payer: Self-pay | Admitting: Internal Medicine

## 2015-08-25 VITALS — BP 108/72 | HR 50 | Temp 98.1°F | Ht 69.0 in | Wt 218.4 lb

## 2015-08-25 DIAGNOSIS — Z0000 Encounter for general adult medical examination without abnormal findings: Secondary | ICD-10-CM

## 2015-08-25 LAB — TSH: TSH: 2.53 u[IU]/mL (ref 0.35–4.50)

## 2015-08-25 LAB — COMPREHENSIVE METABOLIC PANEL
ALBUMIN: 4.5 g/dL (ref 3.5–5.2)
ALT: 32 U/L (ref 0–53)
AST: 24 U/L (ref 0–37)
Alkaline Phosphatase: 75 U/L (ref 39–117)
BUN: 17 mg/dL (ref 6–23)
CALCIUM: 9.6 mg/dL (ref 8.4–10.5)
CHLORIDE: 105 meq/L (ref 96–112)
CO2: 27 meq/L (ref 19–32)
Creatinine, Ser: 1.11 mg/dL (ref 0.40–1.50)
GFR: 75.13 mL/min (ref >60.00)
Glucose, Bld: 110 mg/dL — ABNORMAL HIGH (ref 70–99)
POTASSIUM: 4.7 meq/L (ref 3.5–5.1)
Sodium: 139 mEq/L (ref 135–145)
Total Bilirubin: 0.3 mg/dL (ref 0.2–1.2)
Total Protein: 8 g/dL (ref 6.0–8.3)

## 2015-08-25 LAB — LIPID PANEL
CHOLESTEROL: 176 mg/dL (ref 0–200)
HDL: 43.6 mg/dL (ref >39.00)
LDL CALC: 107 mg/dL — AB (ref 0–99)
NonHDL: 132.23
TRIGLYCERIDES: 128 mg/dL (ref 0.0–149.0)
Total CHOL/HDL Ratio: 4
VLDL: 25.6 mg/dL (ref 0.0–40.0)

## 2015-08-25 LAB — HIV ANTIBODY (ROUTINE TESTING W REFLEX): HIV 1&2 Ab, 4th Generation: NONREACTIVE

## 2015-08-25 LAB — HEMOGLOBIN A1C: Hgb A1c MFr Bld: 5.7 % (ref 4.6–6.5)

## 2015-08-25 NOTE — Progress Notes (Signed)
Subjective:    Patient ID: Bryan Shaw, male    DOB: 04-May-1966, 50 y.o.   MRN: [id number]  DOS:  08/25/2015 Type of visit - description : cpx Interval history: No concerns    Review of Systems Constitutional: No fever. No chills. No unexplained wt changes. No unusual sweats  HEENT: No dental problems, no ear discharge, no facial swelling, no voice changes. No eye discharge, no eye  redness , no  intolerance to light   Respiratory: No wheezing , no  difficulty breathing. No cough , no mucus production  Cardiovascular: No CP, no leg swelling , no  Palpitations  GI: no nausea, no vomiting, no diarrhea , no  abdominal pain.  No blood in the stools. No dysphagia, no odynophagia    Endocrine: No polyphagia, no polyuria , no polydipsia  GU: No dysuria, gross hematuria, difficulty urinating. No urinary urgency, no frequency.  Musculoskeletal: No joint swellings or unusual aches or pains  Skin: No change in the color of the skin, palor , no  Rash  Allergic, immunologic: No environmental allergies , no  food allergies  Neurological: No dizziness no  syncope. No headaches. No diplopia, no slurred, no slurred speech, no motor deficits, no facial  Numbness  Hematological: No enlarged lymph nodes, no easy bruising , no unusual bleedings  Psychiatry: No suicidal ideas, no hallucinations, no beavior problems, no confusion.  No unusual/severe anxiety, no depression    Past Medical History  Diagnosis Date  . Positive PPD 1999    INH x 6 months  . PPD negative 2001  . GERD (gastroesophageal reflux disease)     Past Surgical History  Procedure Laterality Date  . No past surgeries      Social History   Social History  . Marital Status: Married    Spouse Name: N/A  . Number of Children: 1  . Years of Education: N/A   Occupational History  . furniture sales    Social History Main Topics  . Smoking status: Never Smoker   . Smokeless tobacco: Never Used  . Alcohol  Use: No  . Drug Use: No  . Sexual Activity: Yes    Birth Control/ Protection: None   Other Topics Concern  . Not on file   Social History Narrative   From [place]   1 adopted children         Family History  Problem Relation Age of Onset  . Diabetes Neg Hx   . Colon cancer Neg Hx   . Hypertension Neg Hx   . Coronary artery disease Neg Hx   . Stroke Neg Hx   . Prostate cancer Neg Hx   . Pancreatic cancer Sister        Medication List       This list is accurate as of: 08/25/15  4:51 PM.  Always use your most recent med list.               FISH OIL PO  Take by mouth daily.     OSTEO BI-FLEX REGULAR STRENGTH PO  Take by mouth daily.     ranitidine 150 MG tablet  Commonly known as:  ZANTAC  Take 150 mg by mouth as needed for heartburn.           Objective:   Physical Exam BP 108/72 mmHg  Pulse 50  Temp(Src) 98.1 F (36.7 C) (Oral)  Ht 5\' 9"  (1.753 m)  Wt 218 lb 6 oz (99.054 kg)  BMI 32.23 kg/m2  SpO2 97% General:   Well developed, well nourished . NAD.  HEENT:  normocephalic, atraumatic  neck: No thyromegaly Lungs:  CTA B Normal respiratory effort, no intercostal retractions, no accessory muscle use. Heart: RRR,  no murmur.  no pretibial edema bilaterally  Abdomen:  Not distended, soft, non-tender. No rebound or rigidity.  Skin: Not pale. Not jaundice Neurologic:  alert & oriented X3.  Speech normal, gait appropriate for age and unassisted Psych--  Cognition and judgment appear intact.  Cooperative with normal attention span and concentration.  Behavior appropriate. No anxious or depressed appearing.     Assessment & Plan:   Assessment GERD PPD +1999, s/p INH. PPD (-) 2001  PLAN: healthy, doing well

## 2015-08-25 NOTE — Patient Instructions (Signed)
GO TO THE LAB : Get the blood work    GO TO THE FRONT DESK  Schedule a complete physical exam to be done in 1 year Please be fasting

## 2015-08-25 NOTE — Assessment & Plan Note (Signed)
Td 09; flu shot  Declined Diet-exercise discussed  Labs : CMP, FLP, A1c, TSH, HIV

## 2015-08-25 NOTE — Progress Notes (Signed)
Pre visit review using our clinic review tool, if applicable. No additional management support is needed unless otherwise documented below in the visit note. 

## 2016-08-29 ENCOUNTER — Encounter: Payer: Self-pay | Admitting: Internal Medicine

## 2016-08-30 ENCOUNTER — Encounter: Payer: Self-pay | Admitting: Internal Medicine

## 2016-08-30 ENCOUNTER — Ambulatory Visit (INDEPENDENT_AMBULATORY_CARE_PROVIDER_SITE_OTHER): Payer: 59 | Admitting: Internal Medicine

## 2016-08-30 VITALS — BP 108/74 | HR 52 | Temp 98.3°F | Resp 14 | Ht 69.0 in | Wt 216.1 lb

## 2016-08-30 DIAGNOSIS — Z1321 Encounter for screening for nutritional disorder: Secondary | ICD-10-CM | POA: Diagnosis not present

## 2016-08-30 DIAGNOSIS — Z0000 Encounter for general adult medical examination without abnormal findings: Secondary | ICD-10-CM | POA: Diagnosis not present

## 2016-08-30 DIAGNOSIS — R6889 Other general symptoms and signs: Secondary | ICD-10-CM | POA: Diagnosis not present

## 2016-08-30 NOTE — Progress Notes (Signed)
Pre visit review using our clinic review tool, if applicable. No additional management support is needed unless otherwise documented below in the visit note. 

## 2016-08-30 NOTE — Patient Instructions (Signed)
GO TO THE LAB : Get the blood work     GO TO THE FRONT DESK Schedule your next appointment for a  physical exam in one year  

## 2016-08-30 NOTE — Progress Notes (Signed)
   Subjective:    Patient ID: Bryan Shaw, male    DOB: Feb 16, 1966, 51 y.o.   MRN: [id number]  DOS:  08/30/2016 Type of visit - description : cpx Interval history: No major concerns, has a healthy lifestyle   Review of Systems  14 point review of systems is negative   Past Medical History:  Diagnosis Date  . GERD (gastroesophageal reflux disease)   . Positive PPD 1999   INH x 6 months  . PPD negative 2001    Past Surgical History:  Procedure Laterality Date  . NO PAST SURGERIES      Social History   Social History  . Marital status: Married    Spouse name: N/A  . Number of children: 1  . Years of education: N/A   Occupational History  . furniture sales    Social History Main Topics  . Smoking status: Never Smoker  . Smokeless tobacco: Never Used  . Alcohol use No  . Drug use: No  . Sexual activity: Yes    Birth control/ protection: None   Other Topics Concern  . Not on file   Social History Narrative   From [place], wife from [place]    1 adopted son, adult, lives w/ them         Family History  Problem Relation Age of Onset  . Pancreatic cancer Sister   . Diabetes Neg Hx   . Colon cancer Neg Hx   . Hypertension Neg Hx   . Coronary artery disease Neg Hx   . Stroke Neg Hx   . Prostate cancer Neg Hx      Allergies as of 08/30/2016   No Known Allergies     Medication List       Accurate as of 08/30/16  9:42 PM. Always use your most recent med list.          OSTEO BI-FLEX REGULAR STRENGTH PO Take by mouth daily.   ranitidine 150 MG tablet Commonly known as:  ZANTAC Take 150 mg by mouth as needed for heartburn.          Objective:   Physical Exam BP 108/74 (BP Location: Left Arm, Patient Position: Sitting, Cuff Size: Normal)   Pulse (!) 52   Temp 98.3 F (36.8 C) (Oral)   Resp 14   Ht 5\' 9"  (1.753 m)   Wt 216 lb 2 oz (98 kg)   SpO2 98%   BMI 31.92 kg/m   General:   Well developed, well nourished . NAD.  Neck: No   thyromegaly  HEENT:  Normocephalic . Face symmetric, atraumatic Lungs:  CTA B Normal respiratory effort, no intercostal retractions, no accessory muscle use. Heart: RRR,  no murmur.  No pretibial edema bilaterally  Abdomen:  Not distended, soft, non-tender. No rebound or rigidity.   Skin: Exposed areas without rash. Not pale. Not jaundice Neurologic:  alert & oriented X3.  Speech normal, gait appropriate for age and unassisted Strength symmetric and appropriate for age.  Psych: Cognition and judgment appear intact.  Cooperative with normal attention span and concentration.  Behavior appropriate. No anxious or depressed appearing.    Assessment & Plan:   Assessment GERD PPD +1999, s/p INH. PPD (-) 2001  PLAN:  GERD: Controlled RTC 1 year

## 2016-08-30 NOTE — Assessment & Plan Note (Signed)
Td 09; flu shot  Declined Diet-exercise discussed , he is actually doing very well. Goes to the gym 2 or 3 times a week, eats healthy Labs :  BMP, FLP, CBC, A1c. Also like his blood type, aware that may not be covered by insurance, request [facility]

## 2016-08-31 LAB — BASIC METABOLIC PANEL
BUN / CREAT RATIO: 14 (ref 9–20)
BUN: 15 mg/dL (ref 6–24)
CALCIUM: 9.6 mg/dL (ref 8.7–10.2)
CO2: 20 mmol/L (ref 18–29)
Chloride: 104 mmol/L (ref 96–106)
Creatinine, Ser: 1.1 mg/dL (ref 0.76–1.27)
GFR, EST AFRICAN AMERICAN: 91 mL/min/{1.73_m2} (ref >59)
GFR, EST NON AFRICAN AMERICAN: 78 mL/min/{1.73_m2} (ref >59)
Glucose: 100 mg/dL — ABNORMAL HIGH (ref 65–99)
Potassium: 4.5 mmol/L (ref 3.5–5.2)
Sodium: 144 mmol/L (ref 134–144)

## 2016-08-31 LAB — SPECIMEN STATUS REPORT

## 2016-08-31 LAB — HEMOGLOBIN A1C
Est. average glucose Bld gHb Est-mCnc: 126 mg/dL
Hgb A1c MFr Bld: 6 % — ABNORMAL HIGH (ref 4.8–5.6)

## 2016-08-31 LAB — CBC WITH DIFFERENTIAL/PLATELET
BASOS: 0 %
Basophils Absolute: 0 10*3/uL (ref 0.0–0.2)
EOS (ABSOLUTE): 0.2 10*3/uL (ref 0.0–0.4)
EOS: 3 %
HEMATOCRIT: 49 % (ref 37.5–51.0)
HEMOGLOBIN: 15.9 g/dL (ref 13.0–17.7)
IMMATURE GRANS (ABS): 0 10*3/uL (ref 0.0–0.1)
Immature Granulocytes: 0 %
LYMPHS ABS: 2.5 10*3/uL (ref 0.7–3.1)
LYMPHS: 33 %
MCH: 27.4 pg (ref 26.6–33.0)
MCHC: 32.4 g/dL (ref 31.5–35.7)
MCV: 84 fL (ref 79–97)
Monocytes Absolute: 0.5 10*3/uL (ref 0.1–0.9)
Monocytes: 6 %
NEUTROS ABS: 4.3 10*3/uL (ref 1.4–7.0)
Neutrophils: 58 %
Platelets: 180 10*3/uL (ref 150–379)
RBC: 5.81 x10E6/uL — ABNORMAL HIGH (ref 4.14–5.80)
RDW: 15.2 % (ref 12.3–15.4)
WBC: 7.4 10*3/uL (ref 3.4–10.8)

## 2016-08-31 LAB — LIPID PANEL
Chol/HDL Ratio: 3.9 ratio units (ref 0.0–5.0)
Cholesterol, Total: 170 mg/dL (ref 100–199)
HDL: 44 mg/dL (ref >39)
LDL Calculated: 100 mg/dL — ABNORMAL HIGH (ref 0–99)
Triglycerides: 131 mg/dL (ref 0–149)
VLDL CHOLESTEROL CAL: 26 mg/dL (ref 5–40)

## 2016-09-01 LAB — SPECIMEN STATUS REPORT

## 2016-09-01 LAB — PLEASE NOTE

## 2016-09-01 LAB — ABO/RH: RH TYPE: POSITIVE

## 2016-09-05 DIAGNOSIS — H35363 Drusen (degenerative) of macula, bilateral: Secondary | ICD-10-CM | POA: Diagnosis not present

## 2016-09-05 DIAGNOSIS — H40013 Open angle with borderline findings, low risk, bilateral: Secondary | ICD-10-CM | POA: Diagnosis not present

## 2016-09-05 DIAGNOSIS — H25013 Cortical age-related cataract, bilateral: Secondary | ICD-10-CM | POA: Diagnosis not present

## 2016-10-25 ENCOUNTER — Ambulatory Visit (INDEPENDENT_AMBULATORY_CARE_PROVIDER_SITE_OTHER): Payer: 59 | Admitting: Internal Medicine

## 2016-10-25 ENCOUNTER — Ambulatory Visit (HOSPITAL_BASED_OUTPATIENT_CLINIC_OR_DEPARTMENT_OTHER)
Admission: RE | Admit: 2016-10-25 | Discharge: 2016-10-25 | Disposition: A | Discharge status: Home / Self Care | Payer: 59 | Source: Ambulatory Visit | Attending: Internal Medicine | Admitting: Internal Medicine

## 2016-10-25 ENCOUNTER — Encounter: Payer: Self-pay | Admitting: Internal Medicine

## 2016-10-25 VITALS — BP 122/78 | HR 52 | Temp 98.1°F | Resp 14 | Ht 69.0 in | Wt 215.4 lb

## 2016-10-25 DIAGNOSIS — I998 Other disorder of circulatory system: Secondary | ICD-10-CM | POA: Insufficient documentation

## 2016-10-25 DIAGNOSIS — R51 Headache: Secondary | ICD-10-CM

## 2016-10-25 DIAGNOSIS — Z09 Encounter for follow-up examination after completed treatment for conditions other than malignant neoplasm: Secondary | ICD-10-CM | POA: Insufficient documentation

## 2016-10-25 DIAGNOSIS — R519 Headache, unspecified: Secondary | ICD-10-CM

## 2016-10-25 NOTE — Patient Instructions (Signed)
Will schedule a CT scan  If the headache does not continue improving or you have severe pain: let us know

## 2016-10-25 NOTE — Progress Notes (Signed)
Subjective:    Patient ID: Bryan Shaw, male    DOB: 1965/07/19, 51 y.o.   MRN: [id number]  DOS:  10/25/2016 Type of visit - description : acute Interval history: Symptoms started 10/17/2016 when he was visiting [place]: Developed a headache described as a very sharp pain at the L parietal area, last few seconds, several episodes a day,  described as intense. Since then, the pain is on and off In the last few days intensity has decreased. Pain is not associated with nausea, vomiting, head or neck motion.  Review of Systems No fever chills. No ear pain or discharge No head or neck injury No TMJ pain.  Past Medical History:  Diagnosis Date  . GERD (gastroesophageal reflux disease)   . Positive PPD 1999   INH x 6 months  . PPD negative 2001    Past Surgical History:  Procedure Laterality Date  . NO PAST SURGERIES      Social History   Social History  . Marital status: Married    Spouse name: N/A  . Number of children: 1  . Years of education: N/A   Occupational History  . furniture sales    Social History Main Topics  . Smoking status: Never Smoker  . Smokeless tobacco: Never Used  . Alcohol use No  . Drug use: No  . Sexual activity: Yes    Birth control/ protection: None   Other Topics Concern  . Not on file   Social History Narrative   From [place], wife from [place]    1 adopted son, adult, lives w/ them          Allergies as of 10/25/2016   No Known Allergies     Medication List       Accurate as of 10/25/16  3:01 PM. Always use your most recent med list.          OSTEO BI-FLEX REGULAR STRENGTH PO Take by mouth daily.   ranitidine 150 MG tablet Commonly known as:  ZANTAC Take 150 mg by mouth as needed for heartburn.          Objective:   Physical Exam  HENT:  Head:     BP 122/78 (BP Location: Left Arm, Patient Position: Sitting, Cuff Size: Normal)   Pulse (!) 52   Temp 98.1 F (36.7 C) (Oral)   Resp 14   Ht 5\' 9"   (1.753 m)   Wt 215 lb 6 oz (97.7 kg)   SpO2 98%   BMI 31.81 kg/m  General:   Well developed, well nourished . NAD.  HEENT:  Normocephalic . Face symmetric, atraumatic. EOMI, pupils equal and reactive. Neck: No TTP at the cervical spine, range of motion normal TMJ without click or TTP Skin: Not pale. Not jaundice. No rash at the head or neck area Neurologic:  alert & oriented X3.  Speech normal, gait appropriate for age and unassisted. Motor and DTRs symmetric Psych--  Cognition and judgment appear intact.  Cooperative with normal attention span and concentration.  Behavior appropriate. No anxious or depressed appearing.      Assessment & Plan:   Assessment GERD PPD +1999, s/p INH. PPD (-) 2001  PLAN:  HA: As described above, atypical, neurological exam normal. This is however a new onset of headache,+ "worse of life". Doesn't seem to be MSK or neck related. Plan: CT head otherwise observation. Call if symptoms severe or if HAs don't continue to go improve. Okay to take Tylenol or Motrin  as needed.

## 2016-10-25 NOTE — Assessment & Plan Note (Signed)
HA: As described above, atypical, neurological exam normal. This is however a new onset of headache,+ "worse of life". Doesn't seem to be MSK or neck related. Plan: CT head otherwise observation. Call if symptoms severe or if HAs don't continue to go improve. Okay to take Tylenol or Motrin as needed.

## 2016-10-25 NOTE — Progress Notes (Signed)
Pre visit review using our clinic review tool, if applicable. No additional management support is needed unless otherwise documented below in the visit note. 

## 2017-05-28 ENCOUNTER — Encounter: Payer: Self-pay | Admitting: Internal Medicine

## 2017-06-05 ENCOUNTER — Encounter: Payer: Self-pay | Admitting: Internal Medicine

## 2017-06-05 ENCOUNTER — Ambulatory Visit (INDEPENDENT_AMBULATORY_CARE_PROVIDER_SITE_OTHER): Payer: 59 | Admitting: Internal Medicine

## 2017-06-05 VITALS — BP 134/72 | HR 49 | Temp 98.2°F | Resp 14 | Ht 69.0 in | Wt 212.2 lb

## 2017-06-05 DIAGNOSIS — F419 Anxiety disorder, unspecified: Secondary | ICD-10-CM | POA: Diagnosis not present

## 2017-06-05 DIAGNOSIS — R739 Hyperglycemia, unspecified: Secondary | ICD-10-CM | POA: Diagnosis not present

## 2017-06-05 LAB — HEMOGLOBIN A1C: Hgb A1c MFr Bld: 5.8 % (ref 4.6–6.5)

## 2017-06-05 NOTE — Patient Instructions (Signed)
GO TO THE LAB : Get the blood work     GO TO THE FRONT DESK Schedule your next appointment for a   physical exam by 08-2017   To learn more about prediabetes:  The American diabetes Association  Http://www.diabetes.org  The [facility] web site for Diabetes

## 2017-06-05 NOTE — Progress Notes (Signed)
Subjective:    Patient ID: Bryan Shaw, male    DOB: 1965/11/12, 51 y.o.   MRN: [id number]  DOS:  06/05/2017 Type of visit - description : acute Interval history: Anxiety: Mother lives in [place] by herself, he is worried about that for the last year.  Needs a letter of support Prediabetes: Concerned about the diagnosis, we had a extensive discussion. Was seen with headache: No further symptoms, now has just a sporadic mild, routine headaches.   Review of Systems Denies depression per se.  No suicidal ideas Occasional insomnia.  Past Medical History:  Diagnosis Date  . GERD (gastroesophageal reflux disease)   . Positive PPD 1999   INH x 6 months  . PPD negative 2001    Past Surgical History:  Procedure Laterality Date  . NO PAST SURGERIES      Social History   Socioeconomic History  . Marital status: Married    Spouse name: Not on file  . Number of children: 1  . Years of education: Not on file  . Highest education level: Not on file  Social Needs  . Financial resource strain: Not on file  . Food insecurity - worry: Not on file  . Food insecurity - inability: Not on file  . Transportation needs - medical: Not on file  . Transportation needs - non-medical: Not on file  Occupational History  . Occupation: Economist  Tobacco Use  . Smoking status: Never Smoker  . Smokeless tobacco: Never Used  Substance and Sexual Activity  . Alcohol use: No  . Drug use: No  . Sexual activity: Yes    Birth control/protection: None  Other Topics Concern  . Not on file  Social History Narrative   From [place], wife from [place]    1 adopted son, adult, lives w/ them          Allergies as of 06/05/2017   No Known Allergies     Medication List        Accurate as of 06/05/17  7:30 PM. Always use your most recent med list.          OSTEO BI-FLEX REGULAR STRENGTH PO Take by mouth daily.   ranitidine 150 MG tablet Commonly known as:  ZANTAC Take 150 mg  by mouth as needed for heartburn.          Objective:   Physical Exam BP 134/72 (BP Location: Right Arm, Patient Position: Sitting, Cuff Size: Small)   Pulse (!) 49   Temp 98.2 F (36.8 C) (Oral)   Resp 14   Ht 5\' 9"  (1.753 m)   Wt 212 lb 4 oz (96.3 kg)   SpO2 98%   BMI 31.34 kg/m  General:   Well developed, well nourished . NAD.  HEENT:  Normocephalic . Face symmetric, atraumatic Lungs:  CTA B Normal respiratory effort, no intercostal retractions, no accessory muscle use. Heart: RRR,  no murmur.  No pretibial edema bilaterally  Skin: Not pale. Not jaundice Neurologic:  alert & oriented X3.  Speech normal, gait appropriate for age and unassisted Psych--  Cognition and judgment appear intact.  Cooperative with normal attention span and concentration.  Behavior appropriate. No anxious or depressed appearing.      Assessment & Plan:    Assessment Prediabetes (a1c 6.0  08/2016) GERD PPD +1999, s/p INH. PPD (-) 2001  PLAN:  Prediabetes: Last A1c discussed, diet and exercise advice provided.  Recommend self-learning,  see AVS Anxiety: Has mild anxiety and  occasional insomnia related to being worried about his mother who lives by herself in [place].  Letter of support provided. Headache: See last visit, severe headache resolved, now has his mild usual HAs Decline a flu shot RTC 08-2017, CPX

## 2017-06-05 NOTE — Progress Notes (Signed)
Pre visit review using our clinic review tool, if applicable. No additional management support is needed unless otherwise documented below in the visit note. 

## 2017-06-05 NOTE — Assessment & Plan Note (Signed)
Prediabetes: Last A1c discussed, diet and exercise advice provided.  Recommend self-learning,  see AVS Anxiety: Has mild anxiety and occasional insomnia related to being worried about his mother who lives by herself in [place].  Letter of support provided. Headache: See last visit, severe headache resolved, now has his mild usual HAs Decline a flu shot RTC 08-2017, CPX

## 2017-07-02 HISTORY — PX: COLONOSCOPY: SHX174

## 2017-09-05 ENCOUNTER — Ambulatory Visit (INDEPENDENT_AMBULATORY_CARE_PROVIDER_SITE_OTHER): Payer: 59 | Admitting: Internal Medicine

## 2017-09-05 ENCOUNTER — Encounter: Payer: Self-pay | Admitting: Internal Medicine

## 2017-09-05 VITALS — BP 126/85 | HR 50 | Ht 69.0 in | Wt 213.8 lb

## 2017-09-05 DIAGNOSIS — Z23 Encounter for immunization: Secondary | ICD-10-CM | POA: Diagnosis not present

## 2017-09-05 DIAGNOSIS — R7303 Prediabetes: Secondary | ICD-10-CM | POA: Diagnosis not present

## 2017-09-05 DIAGNOSIS — Z0000 Encounter for general adult medical examination without abnormal findings: Secondary | ICD-10-CM

## 2017-09-05 DIAGNOSIS — Z125 Encounter for screening for malignant neoplasm of prostate: Secondary | ICD-10-CM | POA: Diagnosis not present

## 2017-09-05 LAB — COMPREHENSIVE METABOLIC PANEL
ALK PHOS: 83 U/L (ref 39–117)
ALT: 19 U/L (ref 0–53)
AST: 15 U/L (ref 0–37)
Albumin: 4.4 g/dL (ref 3.5–5.2)
BILIRUBIN TOTAL: 0.5 mg/dL (ref 0.2–1.2)
BUN: 16 mg/dL (ref 6–23)
CO2: 29 mEq/L (ref 19–32)
Calcium: 9.8 mg/dL (ref 8.4–10.5)
Chloride: 104 mEq/L (ref 96–112)
Creatinine, Ser: 1.15 mg/dL (ref 0.40–1.50)
GFR: 71.52 mL/min (ref >60.00)
GLUCOSE: 97 mg/dL (ref 70–99)
POTASSIUM: 4.1 meq/L (ref 3.5–5.1)
Sodium: 140 mEq/L (ref 135–145)
TOTAL PROTEIN: 7.5 g/dL (ref 6.0–8.3)

## 2017-09-05 LAB — PSA: PSA: 0.63 ng/mL (ref 0.10–4.00)

## 2017-09-05 LAB — CBC WITH DIFFERENTIAL/PLATELET
BASOS PCT: 0.4 % (ref 0.0–3.0)
Basophils Absolute: 0 10*3/uL (ref 0.0–0.1)
EOS ABS: 0.2 10*3/uL (ref 0.0–0.7)
Eosinophils Relative: 2.7 % (ref 0.0–5.0)
HCT: 49.4 % (ref 39.0–52.0)
HEMOGLOBIN: 16.6 g/dL (ref 13.0–17.0)
Lymphocytes Relative: 30.5 % (ref 12.0–46.0)
Lymphs Abs: 2.8 10*3/uL (ref 0.7–4.0)
MCHC: 33.6 g/dL (ref 30.0–36.0)
MCV: 85.8 fl (ref 78.0–100.0)
Monocytes Absolute: 0.7 10*3/uL (ref 0.1–1.0)
Monocytes Relative: 7.3 % (ref 3.0–12.0)
Neutro Abs: 5.4 10*3/uL (ref 1.4–7.7)
Neutrophils Relative %: 59.1 % (ref 43.0–77.0)
Platelets: 172 10*3/uL (ref 150.0–400.0)
RBC: 5.76 Mil/uL (ref 4.22–5.81)
RDW: 14.5 % (ref 11.5–15.5)
WBC: 9.1 10*3/uL (ref 4.0–10.5)

## 2017-09-05 LAB — HEMOGLOBIN A1C: Hgb A1c MFr Bld: 5.8 % (ref 4.6–6.5)

## 2017-09-05 LAB — LIPID PANEL
Cholesterol: 162 mg/dL (ref 0–200)
HDL: 41.4 mg/dL (ref >39.00)
LDL Cholesterol: 91 mg/dL (ref 0–99)
NONHDL: 120.26
TRIGLYCERIDES: 145 mg/dL (ref 0.0–149.0)
Total CHOL/HDL Ratio: 4
VLDL: 29 mg/dL (ref 0.0–40.0)

## 2017-09-05 LAB — TSH: TSH: 3.58 u[IU]/mL (ref 0.35–4.50)

## 2017-09-05 NOTE — Progress Notes (Signed)
Pre visit review using our clinic review tool, if applicable. No additional management support is needed unless otherwise documented below in the visit note. 

## 2017-09-05 NOTE — Assessment & Plan Note (Signed)
Prediabetes: A1c in December was 5.8.  Recheck today. GERD: On PPIs, asymptomatic Anxiety: See last visit, resolved. RTC one year, CPX

## 2017-09-05 NOTE — Patient Instructions (Addendum)
GO TO THE LAB : Get the blood work     GO TO THE FRONT DESK Schedule your next appointment for a routine sickle exam in 1 year

## 2017-09-05 NOTE — Assessment & Plan Note (Addendum)
-  Tdap today -CCS: 2 modalities discussed.  Elected to colonoscopy, will call me within few weeks to set up -prostate ca: DRE negative, checking a PSA Diet-exercise discussed.  He remains active, goes to the gym twice a week and eats healthy -labs : CMP, FLP, CBC, TSH, PSA, A1C

## 2017-09-05 NOTE — Progress Notes (Signed)
Subjective:    Patient ID: Bryan Shaw, male    DOB: 09-Apr-1966, 52 y.o.   MRN: [id number]  DOS:  09/05/2017 Type of visit - description : CPX Interval history: No concerns   Review of Systems  A 14 point review of systems is negative    Past Medical History:  Diagnosis Date  . GERD (gastroesophageal reflux disease)   . Positive PPD 1999   INH x 6 months  . PPD negative 2001    Past Surgical History:  Procedure Laterality Date  . NO PAST SURGERIES      Social History   Socioeconomic History  . Marital status: Married    Spouse name: Not on file  . Number of children: 1  . Years of education: Not on file  . Highest education level: Not on file  Social Needs  . Financial resource strain: Not on file  . Food insecurity - worry: Not on file  . Food insecurity - inability: Not on file  . Transportation needs - medical: Not on file  . Transportation needs - non-medical: Not on file  Occupational History  . Occupation: Economist  Tobacco Use  . Smoking status: Never Smoker  . Smokeless tobacco: Never Used  Substance and Sexual Activity  . Alcohol use: No  . Drug use: No  . Sexual activity: Yes    Birth control/protection: None  Other Topics Concern  . Not on file  Social History Narrative   From [place], wife from [place]    1 adopted son, adult, lives w/ them         Family History  Problem Relation Age of Onset  . Pancreatic cancer Sister   . Diabetes Neg Hx   . Colon cancer Neg Hx   . Hypertension Neg Hx   . Coronary artery disease Neg Hx   . Stroke Neg Hx   . Prostate cancer Neg Hx      Allergies as of 09/05/2017   No Known Allergies     Medication List        Accurate as of 09/05/17  5:34 PM. Always use your most recent med list.          omeprazole 10 MG capsule Commonly known as:  PRILOSEC Take 10 mg by mouth daily.   OSTEO BI-FLEX REGULAR STRENGTH PO Take by mouth daily.          Objective:   Physical Exam BP  126/85 (BP Location: Right Arm, Patient Position: Sitting, Cuff Size: Large)   Pulse (!) 50   Ht 5\' 9"  (1.753 m)   Wt 213 lb 12.8 oz (97 kg)   SpO2 100%   BMI 31.57 kg/m  General:   Well developed, well nourished . NAD.  Neck: No  thyromegaly  HEENT:  Normocephalic . Face symmetric, atraumatic Lungs:  CTA B Normal respiratory effort, no intercostal retractions, no accessory muscle use. Heart: RRR,  no murmur.  No pretibial edema bilaterally  Abdomen:  Not distended, soft, non-tender. No rebound or rigidity.   Skin: Exposed areas without rash. Not pale. Not jaundice Rectal: External abnormalities: none. Normal sphincter tone. No rectal masses or tenderness.  No stools Prostate: Prostate gland firm and smooth, no enlargement, nodularity, tenderness, mass, asymmetry or induration Neurologic:  alert & oriented X3.  Speech normal, gait appropriate for age and unassisted Strength symmetric and appropriate for age.  Psych: Cognition and judgment appear intact.  Cooperative with normal attention span and concentration.  Behavior  appropriate. No anxious or depressed appearing.     Assessment & Plan:   Assessment Prediabetes (a1c 6.0  08/2016) GERD PPD +1999, s/p INH. PPD (-) 2001  PLAN:  Prediabetes: A1c in December was 5.8.  Recheck today. GERD: On PPIs, asymptomatic Anxiety: See last visit, resolved. RTC one year, CPX

## 2017-09-06 ENCOUNTER — Encounter: Payer: Self-pay | Admitting: Internal Medicine

## 2017-09-06 DIAGNOSIS — Z1211 Encounter for screening for malignant neoplasm of colon: Secondary | ICD-10-CM

## 2017-09-11 DIAGNOSIS — H40013 Open angle with borderline findings, low risk, bilateral: Secondary | ICD-10-CM | POA: Diagnosis not present

## 2017-09-18 ENCOUNTER — Encounter: Payer: Self-pay | Admitting: Gastroenterology

## 2017-10-24 ENCOUNTER — Ambulatory Visit (AMBULATORY_SURGERY_CENTER): Payer: Self-pay

## 2017-10-24 VITALS — Ht 70.0 in | Wt 214.4 lb

## 2017-10-24 DIAGNOSIS — Z1211 Encounter for screening for malignant neoplasm of colon: Secondary | ICD-10-CM

## 2017-10-24 MED ORDER — PEG-KCL-NACL-NASULF-NA ASC-C 140 G PO SOLR: 1.0000 | Freq: Once | ORAL | Status: AC

## 2017-10-24 NOTE — Progress Notes (Signed)
Per pt, no allergies to soy or egg products.Pt not taking any weight loss meds or using  O2 at home.  Emmi video sent to pt's email. 

## 2017-10-25 ENCOUNTER — Encounter: Payer: Self-pay | Admitting: Gastroenterology

## 2017-11-07 ENCOUNTER — Encounter: Payer: Self-pay | Admitting: Gastroenterology

## 2017-11-07 ENCOUNTER — Ambulatory Visit (AMBULATORY_SURGERY_CENTER): Payer: 59 | Admitting: Gastroenterology

## 2017-11-07 ENCOUNTER — Other Ambulatory Visit: Payer: Self-pay

## 2017-11-07 VITALS — BP 102/62 | HR 62 | Temp 98.6°F | Resp 16 | Ht 69.0 in | Wt 213.0 lb

## 2017-11-07 DIAGNOSIS — Z1211 Encounter for screening for malignant neoplasm of colon: Secondary | ICD-10-CM | POA: Diagnosis present

## 2017-11-07 DIAGNOSIS — D123 Benign neoplasm of transverse colon: Secondary | ICD-10-CM

## 2017-11-07 DIAGNOSIS — K635 Polyp of colon: Secondary | ICD-10-CM | POA: Diagnosis not present

## 2017-11-07 MED ORDER — SODIUM CHLORIDE 0.9 % IV SOLN: 500.0000 mL | Freq: Once | INTRAVENOUS | Status: DC

## 2017-11-07 NOTE — Progress Notes (Signed)
Called to room to assist during endoscopic procedure.  Patient ID and intended procedure confirmed with present staff. Received instructions for my participation in the procedure from the performing physician.  

## 2017-11-07 NOTE — Op Note (Signed)
[place] Patient Name: Bryan Shaw Procedure Date: 11/07/2017 9:55 AM MRN: [id number] Endoscopist: Leslie. Loletha Carrow , MD Age: 52 Referring MD:  Date of Birth: 1966-01-26 Gender: Male Account #: [id number] Procedure:                Colonoscopy Indications:              Screening for colorectal malignant neoplasm, This                            is the patient's first colonoscopy Medicines:                Monitored Anesthesia Care Procedure:                Pre-Anesthesia Assessment:                           - Prior to the procedure, a History and Physical                            was performed, and patient medications and                            allergies were reviewed. The patient's tolerance of                            previous anesthesia was also reviewed. The risks                            and benefits of the procedure and the sedation                            options and risks were discussed with the patient.                            All questions were answered, and informed consent                            was obtained. Prior Anticoagulants: The patient has                            taken no previous anticoagulant or antiplatelet                            agents. ASA Grade Assessment: I - A normal, healthy                            patient. After reviewing the risks and benefits,                            the patient was deemed in satisfactory condition to                            undergo the procedure.  After obtaining informed consent, the colonoscope                            was passed under direct vision. Throughout the                            procedure, the patient's blood pressure, pulse, and                            oxygen saturations were monitored continuously. The                            Colonoscope was introduced through the anus and                            advanced to the the cecum, identified by                             appendiceal orifice and ileocecal valve. The                            colonoscopy was performed without difficulty. The                            patient tolerated the procedure well. The quality                            of the bowel preparation was excellent. The                            ileocecal valve, appendiceal orifice, and rectum                            were photographed. The quality of the bowel                            preparation was evaluated using the BBPS [facility]                            Bowel Preparation Scale) with scores of: Right                            Colon = 3, Transverse Colon = 3 and Left Colon = 3                            (entire mucosa seen well with no residual staining,                            small fragments of stool or opaque liquid). The                            total BBPS score equals 9. Scope In: 10:02:32 AM Scope Out: 10:18:29 AM Scope Withdrawal Time: 0 hours 11  minutes 13 seconds  Total Procedure Duration: 0 hours 15 minutes 57 seconds  Findings:                 The perianal and digital rectal examinations were                            normal.                           A 2 mm polyp was found in the hepatic flexure. The                            polyp was sessile. The polyp was removed with a                            cold snare. Resection and retrieval were complete.                           Internal hemorrhoids were found. The hemorrhoids                            were small and Grade I (internal hemorrhoids that                            do not prolapse).                           The exam was otherwise without abnormality on                            direct and retroflexion views. Complications:            No immediate complications. Estimated Blood Loss:     Estimated blood loss was minimal. Impression:               - One 2 mm polyp at the hepatic flexure, removed                             with a cold snare. Resected and retrieved.                           - Internal hemorrhoids.                           - The examination was otherwise normal on direct                            and retroflexion views. Recommendation:           - Patient has a contact number available for                            emergencies. The signs and symptoms of potential                            delayed complications were discussed with the  patient. Return to normal activities tomorrow.                            Written discharge instructions were provided to the                            patient.                           - Resume previous diet.                           - Continue present medications.                           - Await pathology results.                           - Repeat colonoscopy is recommended for                            surveillance. The colonoscopy date will be                            determined after pathology results from today's                            exam become available for review. Ishika Chesterfield L. Loletha Carrow, MD 11/07/2017 10:22:02 AM This report has been signed electronically.

## 2017-11-07 NOTE — Progress Notes (Signed)
Pt's states no medical or surgical changes since previsit or office visit. 

## 2017-11-07 NOTE — Patient Instructions (Signed)
Impression/Recommendations:  Polyp handout given to patient. Hemorrhoid handout given to patient.  Resume previous diet. Continue present medications.  Repeat colonoscopy recommended for surveillance.  Date to be determined after pathology results reviewed.  YOU HAD AN ENDOSCOPIC PROCEDURE TODAY AT THE [facility]:   Refer to the procedure report that was given to you for any specific questions about what was found during the examination.  If the procedure report does not answer your questions, please call your gastroenterologist to clarify.  If you requested that your care partner not be given the details of your procedure findings, then the procedure report has been included in a sealed envelope for you to review at your convenience later.  YOU SHOULD EXPECT: Some feelings of bloating in the abdomen. Passage of more gas than usual.  Walking can help get rid of the air that was put into your GI tract during the procedure and reduce the bloating. If you had a lower endoscopy (such as a colonoscopy or flexible sigmoidoscopy) you may notice spotting of blood in your stool or on the toilet paper. If you underwent a bowel prep for your procedure, you may not have a normal bowel movement for a few days.  Please Note:  You might notice some irritation and congestion in your nose or some drainage.  This is from the oxygen used during your procedure.  There is no need for concern and it should clear up in a day or so.  SYMPTOMS TO REPORT IMMEDIATELY:   Following lower endoscopy (colonoscopy or flexible sigmoidoscopy):  Excessive amounts of blood in the stool  Significant tenderness or worsening of abdominal pains  Swelling of the abdomen that is new, acute  Fever of 100F or higher For urgent or emergent issues, a gastroenterologist can be reached at any hour by calling [phone].   DIET:  We do recommend a small meal at first, but then you may proceed to your regular diet.   Drink plenty of fluids but you should avoid alcoholic beverages for 24 hours.  ACTIVITY:  You should plan to take it easy for the rest of today and you should NOT DRIVE or use heavy machinery until tomorrow (because of the sedation medicines used during the test).    FOLLOW UP: Our staff will call the number listed on your records the next business day following your procedure to check on you and address any questions or concerns that you may have regarding the information given to you following your procedure. If we do not reach you, we will leave a message.  However, if you are feeling well and you are not experiencing any problems, there is no need to return our call.  We will assume that you have returned to your regular daily activities without incident.  If any biopsies were taken you will be contacted by phone or by letter within the next 1-3 weeks.  Please call us at [phone] if you have not heard about the biopsies in 3 weeks.    SIGNATURES/CONFIDENTIALITY: You and/or your care partner have signed paperwork which will be entered into your electronic medical record.  These signatures attest to the fact that that the information above on your After Visit Summary has been reviewed and is understood.  Full responsibility of the confidentiality of this discharge information lies with you and/or your care-partner. 

## 2017-11-08 ENCOUNTER — Telehealth: Payer: Self-pay

## 2017-11-08 ENCOUNTER — Telehealth: Payer: Self-pay | Admitting: *Deleted

## 2017-11-08 NOTE — Telephone Encounter (Signed)
Left message

## 2017-11-08 NOTE — Telephone Encounter (Signed)
Left message on voicemail.

## 2017-11-12 ENCOUNTER — Encounter: Payer: Self-pay | Admitting: Gastroenterology

## 2018-04-11 ENCOUNTER — Encounter: Payer: Self-pay | Admitting: Internal Medicine

## 2018-04-14 ENCOUNTER — Telehealth: Payer: Self-pay | Admitting: Internal Medicine

## 2018-04-14 DIAGNOSIS — T783XXA Angioneurotic edema, initial encounter: Secondary | ICD-10-CM

## 2018-04-14 MED ORDER — EPINEPHRINE 0.3 MG/0.3ML IJ SOAJ: 0.3000 mg | Freq: Once | INTRAMUSCULAR | 1 refills | Status: AC

## 2018-04-14 NOTE — Telephone Encounter (Signed)
See patient's message and pictures, he has angioedema, 2 episodes, currently asymptomatic.  Explained patient this is potentially a very serious condition. Plan: -Claritin daily -Benadryl if he has another episode -EpiPen and a ER visit if episode is severe , he has tongue swelling, any difficulty breathing. RX sent  - please arrange a allergist referral.   DX angioedema.

## 2018-04-15 NOTE — Telephone Encounter (Signed)
Referral placed.

## 2018-04-16 ENCOUNTER — Encounter: Payer: Self-pay | Admitting: Internal Medicine

## 2018-04-16 DIAGNOSIS — M5136 Other intervertebral disc degeneration, lumbar region: Secondary | ICD-10-CM | POA: Diagnosis not present

## 2018-04-16 DIAGNOSIS — M9905 Segmental and somatic dysfunction of pelvic region: Secondary | ICD-10-CM | POA: Diagnosis not present

## 2018-04-16 DIAGNOSIS — M9903 Segmental and somatic dysfunction of lumbar region: Secondary | ICD-10-CM | POA: Diagnosis not present

## 2018-04-23 DIAGNOSIS — M9903 Segmental and somatic dysfunction of lumbar region: Secondary | ICD-10-CM | POA: Diagnosis not present

## 2018-04-23 DIAGNOSIS — M9905 Segmental and somatic dysfunction of pelvic region: Secondary | ICD-10-CM | POA: Diagnosis not present

## 2018-04-23 DIAGNOSIS — M5136 Other intervertebral disc degeneration, lumbar region: Secondary | ICD-10-CM | POA: Diagnosis not present

## 2018-04-24 DIAGNOSIS — M9905 Segmental and somatic dysfunction of pelvic region: Secondary | ICD-10-CM | POA: Diagnosis not present

## 2018-04-24 DIAGNOSIS — M5136 Other intervertebral disc degeneration, lumbar region: Secondary | ICD-10-CM | POA: Diagnosis not present

## 2018-04-24 DIAGNOSIS — M9903 Segmental and somatic dysfunction of lumbar region: Secondary | ICD-10-CM | POA: Diagnosis not present

## 2018-04-30 DIAGNOSIS — M5136 Other intervertebral disc degeneration, lumbar region: Secondary | ICD-10-CM | POA: Diagnosis not present

## 2018-04-30 DIAGNOSIS — M9903 Segmental and somatic dysfunction of lumbar region: Secondary | ICD-10-CM | POA: Diagnosis not present

## 2018-04-30 DIAGNOSIS — M9905 Segmental and somatic dysfunction of pelvic region: Secondary | ICD-10-CM | POA: Diagnosis not present

## 2018-05-01 DIAGNOSIS — M9903 Segmental and somatic dysfunction of lumbar region: Secondary | ICD-10-CM | POA: Diagnosis not present

## 2018-05-01 DIAGNOSIS — M9905 Segmental and somatic dysfunction of pelvic region: Secondary | ICD-10-CM | POA: Diagnosis not present

## 2018-05-01 DIAGNOSIS — M5136 Other intervertebral disc degeneration, lumbar region: Secondary | ICD-10-CM | POA: Diagnosis not present

## 2018-05-08 DIAGNOSIS — M9903 Segmental and somatic dysfunction of lumbar region: Secondary | ICD-10-CM | POA: Diagnosis not present

## 2018-05-08 DIAGNOSIS — M9905 Segmental and somatic dysfunction of pelvic region: Secondary | ICD-10-CM | POA: Diagnosis not present

## 2018-05-08 DIAGNOSIS — M5136 Other intervertebral disc degeneration, lumbar region: Secondary | ICD-10-CM | POA: Diagnosis not present

## 2018-05-14 DIAGNOSIS — M9905 Segmental and somatic dysfunction of pelvic region: Secondary | ICD-10-CM | POA: Diagnosis not present

## 2018-05-14 DIAGNOSIS — M9903 Segmental and somatic dysfunction of lumbar region: Secondary | ICD-10-CM | POA: Diagnosis not present

## 2018-05-14 DIAGNOSIS — M5136 Other intervertebral disc degeneration, lumbar region: Secondary | ICD-10-CM | POA: Diagnosis not present

## 2018-05-15 DIAGNOSIS — M5136 Other intervertebral disc degeneration, lumbar region: Secondary | ICD-10-CM | POA: Diagnosis not present

## 2018-05-15 DIAGNOSIS — M9905 Segmental and somatic dysfunction of pelvic region: Secondary | ICD-10-CM | POA: Diagnosis not present

## 2018-05-15 DIAGNOSIS — M9903 Segmental and somatic dysfunction of lumbar region: Secondary | ICD-10-CM | POA: Diagnosis not present

## 2018-05-19 DIAGNOSIS — M47816 Spondylosis without myelopathy or radiculopathy, lumbar region: Secondary | ICD-10-CM | POA: Diagnosis not present

## 2018-05-21 DIAGNOSIS — M9903 Segmental and somatic dysfunction of lumbar region: Secondary | ICD-10-CM | POA: Diagnosis not present

## 2018-05-21 DIAGNOSIS — M9905 Segmental and somatic dysfunction of pelvic region: Secondary | ICD-10-CM | POA: Diagnosis not present

## 2018-05-21 DIAGNOSIS — M5136 Other intervertebral disc degeneration, lumbar region: Secondary | ICD-10-CM | POA: Diagnosis not present

## 2018-05-22 DIAGNOSIS — M9905 Segmental and somatic dysfunction of pelvic region: Secondary | ICD-10-CM | POA: Diagnosis not present

## 2018-05-22 DIAGNOSIS — M9903 Segmental and somatic dysfunction of lumbar region: Secondary | ICD-10-CM | POA: Diagnosis not present

## 2018-05-22 DIAGNOSIS — M5136 Other intervertebral disc degeneration, lumbar region: Secondary | ICD-10-CM | POA: Diagnosis not present

## 2018-05-28 DIAGNOSIS — M9905 Segmental and somatic dysfunction of pelvic region: Secondary | ICD-10-CM | POA: Diagnosis not present

## 2018-05-28 DIAGNOSIS — M5136 Other intervertebral disc degeneration, lumbar region: Secondary | ICD-10-CM | POA: Diagnosis not present

## 2018-05-28 DIAGNOSIS — M9903 Segmental and somatic dysfunction of lumbar region: Secondary | ICD-10-CM | POA: Diagnosis not present

## 2018-06-04 DIAGNOSIS — M9905 Segmental and somatic dysfunction of pelvic region: Secondary | ICD-10-CM | POA: Diagnosis not present

## 2018-06-04 DIAGNOSIS — M9903 Segmental and somatic dysfunction of lumbar region: Secondary | ICD-10-CM | POA: Diagnosis not present

## 2018-06-04 DIAGNOSIS — M5136 Other intervertebral disc degeneration, lumbar region: Secondary | ICD-10-CM | POA: Diagnosis not present

## 2018-06-05 DIAGNOSIS — M9905 Segmental and somatic dysfunction of pelvic region: Secondary | ICD-10-CM | POA: Diagnosis not present

## 2018-06-05 DIAGNOSIS — M9903 Segmental and somatic dysfunction of lumbar region: Secondary | ICD-10-CM | POA: Diagnosis not present

## 2018-06-05 DIAGNOSIS — M5136 Other intervertebral disc degeneration, lumbar region: Secondary | ICD-10-CM | POA: Diagnosis not present

## 2018-06-12 DIAGNOSIS — M9905 Segmental and somatic dysfunction of pelvic region: Secondary | ICD-10-CM | POA: Diagnosis not present

## 2018-06-12 DIAGNOSIS — M5136 Other intervertebral disc degeneration, lumbar region: Secondary | ICD-10-CM | POA: Diagnosis not present

## 2018-06-12 DIAGNOSIS — M9903 Segmental and somatic dysfunction of lumbar region: Secondary | ICD-10-CM | POA: Diagnosis not present

## 2018-06-18 DIAGNOSIS — M5136 Other intervertebral disc degeneration, lumbar region: Secondary | ICD-10-CM | POA: Diagnosis not present

## 2018-06-18 DIAGNOSIS — M9903 Segmental and somatic dysfunction of lumbar region: Secondary | ICD-10-CM | POA: Diagnosis not present

## 2018-06-18 DIAGNOSIS — M9905 Segmental and somatic dysfunction of pelvic region: Secondary | ICD-10-CM | POA: Diagnosis not present

## 2018-06-19 DIAGNOSIS — M9905 Segmental and somatic dysfunction of pelvic region: Secondary | ICD-10-CM | POA: Diagnosis not present

## 2018-06-19 DIAGNOSIS — M5136 Other intervertebral disc degeneration, lumbar region: Secondary | ICD-10-CM | POA: Diagnosis not present

## 2018-06-19 DIAGNOSIS — M9903 Segmental and somatic dysfunction of lumbar region: Secondary | ICD-10-CM | POA: Diagnosis not present

## 2018-07-03 DIAGNOSIS — M9905 Segmental and somatic dysfunction of pelvic region: Secondary | ICD-10-CM | POA: Diagnosis not present

## 2018-07-03 DIAGNOSIS — M9903 Segmental and somatic dysfunction of lumbar region: Secondary | ICD-10-CM | POA: Diagnosis not present

## 2018-07-03 DIAGNOSIS — M5136 Other intervertebral disc degeneration, lumbar region: Secondary | ICD-10-CM | POA: Diagnosis not present

## 2018-08-28 DIAGNOSIS — M5136 Other intervertebral disc degeneration, lumbar region: Secondary | ICD-10-CM | POA: Diagnosis not present

## 2018-08-28 DIAGNOSIS — M9905 Segmental and somatic dysfunction of pelvic region: Secondary | ICD-10-CM | POA: Diagnosis not present

## 2018-08-28 DIAGNOSIS — M9903 Segmental and somatic dysfunction of lumbar region: Secondary | ICD-10-CM | POA: Diagnosis not present

## 2018-09-05 ENCOUNTER — Encounter (HOSPITAL_COMMUNITY): Payer: Self-pay | Admitting: Emergency Medicine

## 2018-09-05 ENCOUNTER — Ambulatory Visit (HOSPITAL_COMMUNITY)
Admission: EM | Admit: 2018-09-05 | Discharge: 2018-09-05 | Disposition: A | Discharge status: Home / Self Care | Payer: 59 | Attending: Family Medicine | Admitting: Family Medicine

## 2018-09-05 DIAGNOSIS — K219 Gastro-esophageal reflux disease without esophagitis: Secondary | ICD-10-CM | POA: Diagnosis not present

## 2018-09-05 MED ORDER — LIDOCAINE VISCOUS HCL 2 % MT SOLN
15.0000 mL | Freq: Once | OROMUCOSAL | Status: AC
  Administered 2018-09-05: 15 mL via ORAL

## 2018-09-05 MED ORDER — OMEPRAZOLE 20 MG PO CPDR: 20.0000 mg | DELAYED_RELEASE_CAPSULE | Freq: Every day | ORAL | 1 refills | Status: AC

## 2018-09-05 MED ORDER — ONDANSETRON 4 MG PO TBDP
ORAL_TABLET | ORAL | Status: AC
  Filled 2018-09-05: qty 1

## 2018-09-05 MED ORDER — ALUM & MAG HYDROXIDE-SIMETH 200-200-20 MG/5ML PO SUSP
30.0000 mL | Freq: Once | ORAL | Status: AC
  Administered 2018-09-05: 30 mL via ORAL

## 2018-09-05 MED ORDER — ALUM & MAG HYDROXIDE-SIMETH 200-200-20 MG/5ML PO SUSP
ORAL | Status: AC
  Filled 2018-09-05: qty 30

## 2018-09-05 MED ORDER — LIDOCAINE VISCOUS HCL 2 % MT SOLN
OROMUCOSAL | Status: AC
  Filled 2018-09-05: qty 15

## 2018-09-05 MED ORDER — ONDANSETRON 4 MG PO TBDP
4.0000 mg | ORAL_TABLET | Freq: Once | ORAL | Status: AC
  Administered 2018-09-05: 4 mg via ORAL

## 2018-09-05 NOTE — ED Triage Notes (Signed)
States last night he ate some fish and this morning he threw it up. Threw up twice. Tried to eat some rice and soup and threw that up.

## 2018-09-05 NOTE — Discharge Instructions (Signed)
I believe your symptoms are associated with acid reflux.  We are going to try omeprazole 20 mg once daily Make sure that you take the omeprazole 30- 60 minutes prior to a meal with a glass of water.  Avoid spicy, greasy foods, caffeine, chocolate and milk products.  No eating 2-3 hours before bedtime. Elevate the head of the bed 30 degrees.  Try this for a few weeks to see if this improves your symptoms.  If you don't see any improvement or your symptoms worsen please follow up with a GI   GI cocktail and Zofran given here in the clinic for symptoms.

## 2018-09-05 NOTE — ED Provider Notes (Signed)
[facility]    CSN: [id number] Arrival date & time: 09/05/18  1119     History   Chief Complaint Chief Complaint  Patient presents with  . Vomiting    HPI [name] is a 53 y.o. male.   Patient is a 53 year old male who presents today with nausea, vomiting.  This started approximate 3 AM.  He reports last meal was fish last night.  He vomited up the fish this morning.  He felt slightly better after vomiting.  He ate some rice soup that his wife made this morning.  And vomited that back up.  After he ate last night he could feel the food coming back up in his esophagus while laying in bed.  He reports this happens from time to time after eating spicy meals.  This time is worse than usual.  He has had to take omeprazole in the past for symptoms similar.  Denies any associated chest pain, shortness of breath, fevers, chills, myalgias, diarrhea.  He is not currently having any abdominal discomfort.  He did have an uneasiness in his stomach prior to vomiting.  He has not taken anything for his symptoms.  He has been sipping water.  He is a non-smoker and does not drink alcohol.   ROS per HPI      Past Medical History:  Diagnosis Date  . GERD (gastroesophageal reflux disease)   . Positive PPD 1999   INH x 6 months  . PPD negative 2001    Patient Active Problem List   Diagnosis Date Noted  . PCP NOTES >>>>>>>>>>>>>> 10/25/2016  . Annual physical exam 11/21/2010  . LIPOMAS, MULTIPLE 09/27/2009  . GERD 05/25/2008    Past Surgical History:  Procedure Laterality Date  . NO PAST SURGERIES         Home Medications    Prior to Admission medications   Medication Sig Start Date End Date Taking? Authorizing Provider  Glucosamine-Chondroitin (OSTEO BI-FLEX REGULAR STRENGTH PO) Take by mouth daily.    [provider]  omeprazole (PRILOSEC) 20 MG capsule Take 1 capsule (20 mg total) by mouth daily. 09/05/18   Orvan July, NP    Family History Family  History  Problem Relation Age of Onset  . Pancreatic cancer Sister   . Diabetes Neg Hx   . Colon cancer Neg Hx   . Hypertension Neg Hx   . Coronary artery disease Neg Hx   . Stroke Neg Hx   . Prostate cancer Neg Hx     Social History Social History   Tobacco Use  . Smoking status: Never Smoker  . Smokeless tobacco: Never Used  Substance Use Topics  . Alcohol use: No  . Drug use: No     Allergies   Patient has no known allergies.   Review of Systems Review of Systems   Physical Exam Triage Vital Signs ED Triage Vitals  Enc Vitals Group     BP 09/05/18 1219 133/86     Pulse Rate 09/05/18 1219 82     Resp 09/05/18 1219 18     Temp 09/05/18 1219 98.1 F (36.7 C)     Temp src --      SpO2 09/05/18 1219 97 %     Weight --      Height --      Head Circumference --      Peak Flow --      Pain Score 09/05/18 1220 0  Pain Loc --      Pain Edu? --      Excl. in [place]? --    No data found.  Updated Vital Signs BP 133/86   Pulse 82   Temp 98.1 F (36.7 C)   Resp 18   SpO2 97%   Visual Acuity Right Eye Distance:   Left Eye Distance:   Bilateral Distance:    Right Eye Near:   Left Eye Near:    Bilateral Near:     Physical Exam Vitals signs and nursing note reviewed.  Constitutional:      General: He is not in acute distress.    Appearance: Normal appearance. He is not ill-appearing, toxic-appearing or diaphoretic.  HENT:     Nose: Nose normal.     Mouth/Throat:     Pharynx: Oropharynx is clear.  Neck:     Musculoskeletal: Normal range of motion.  Pulmonary:     Effort: Pulmonary effort is normal.  Abdominal:     General: There is no distension.     Palpations: Abdomen is soft.     Tenderness: There is abdominal tenderness. There is no guarding or rebound.  Musculoskeletal: Normal range of motion.  Skin:    General: Skin is warm and dry.     Findings: No rash.  Neurological:     Mental Status: He is alert.  Psychiatric:        Mood and  Affect: Mood normal.      UC Treatments / Results  Labs (all labs ordered are listed, but only abnormal results are displayed) Labs Reviewed - No data to display  EKG None  Radiology No results found.  Procedures Procedures (including critical care time)  Medications Ordered in UC Medications  alum & mag hydroxide-simeth (MAALOX/MYLANTA) 200-200-20 MG/5ML suspension 30 mL (30 mLs Oral Given 09/05/18 1243)    And  lidocaine (XYLOCAINE) 2 % viscous mouth solution 15 mL (15 mLs Oral Given 09/05/18 1243)  ondansetron (ZOFRAN-ODT) disintegrating tablet 4 mg (4 mg Oral Given 09/05/18 1243)    Initial Impression / Assessment and Plan / UC Course  I have reviewed the triage vital signs and the nursing notes.  Pertinent labs & imaging results that were available during my care of the patient were reviewed by me and considered in my medical decision making (see chart for details).     Symptoms consistent with GERD GI cocktail and Zofran given here in clinic We will have him start taking omeprazole daily for the next couple weeks Instructions on specific triggers and things to do to hopefully prevent flareup. Recommended that if his symptoms continue despite treatment he will need to follow with GI specialist If his symptoms worsen he will need to go to the hospital Final Clinical Impressions(s) / UC Diagnoses   Final diagnoses:  Gastroesophageal reflux disease, esophagitis presence not specified     Discharge Instructions     I believe your symptoms are associated with acid reflux.  We are going to try omeprazole 20 mg once daily Make sure that you take the omeprazole 30- 60 minutes prior to a meal with a glass of water.  Avoid spicy, greasy foods, caffeine, chocolate and milk products.  No eating 2-3 hours before bedtime. Elevate the head of the bed 30 degrees.  Try this for a few weeks to see if this improves your symptoms.  If you don't see any improvement or your symptoms  worsen please follow up with a GI   GI  cocktail and Zofran given here in the clinic for symptoms.      ED Prescriptions    Medication Sig Dispense Auth. Provider   omeprazole (PRILOSEC) 20 MG capsule Take 1 capsule (20 mg total) by mouth daily. 30 capsule Loura Halt A, NP     Controlled Substance Prescriptions [place] Controlled Substance Registry consulted? Not Applicable   Orvan July, NP 09/05/18 1258

## 2018-09-11 ENCOUNTER — Encounter: Payer: Self-pay | Admitting: Internal Medicine

## 2018-09-11 ENCOUNTER — Other Ambulatory Visit: Payer: Self-pay

## 2018-09-11 ENCOUNTER — Ambulatory Visit (INDEPENDENT_AMBULATORY_CARE_PROVIDER_SITE_OTHER): Payer: 59 | Admitting: Internal Medicine

## 2018-09-11 VITALS — BP 108/70 | HR 52 | Temp 98.4°F | Resp 16 | Ht 69.0 in | Wt 218.5 lb

## 2018-09-11 DIAGNOSIS — Z0000 Encounter for general adult medical examination without abnormal findings: Secondary | ICD-10-CM | POA: Diagnosis not present

## 2018-09-11 DIAGNOSIS — T783XXD Angioneurotic edema, subsequent encounter: Secondary | ICD-10-CM | POA: Diagnosis not present

## 2018-09-11 NOTE — Progress Notes (Signed)
Pre visit review using our clinic review tool, if applicable. No additional management support is needed unless otherwise documented below in the visit note. 

## 2018-09-11 NOTE — Assessment & Plan Note (Signed)
-  Tdap 2019, declines flu shot -CCS: Colonoscopy 10/2017, next per GI -prostate ca: DRE PSA within normal 2019 Diet-exercise discussed.  Late Labs: TSH, CMP, FLP, A1c

## 2018-09-11 NOTE — Progress Notes (Signed)
Subjective:    Patient ID: Bryan Shaw, male    DOB: 1966-04-22, 53 y.o.   MRN: [id number]  DOS:  09/11/2018 Type of visit - description: CPX Several concerns  Review of Systems Back on October 2019, had a fall: that day he had no breakfast, went to the gym, exercised and subsequently went to the sauna. After that he felt extremely weak and dizzy, question of LOC, he fall and landed on his back. developed back pain shortly after, saw a chiropractor, some adjustment was made, now doing better.  He denies any headaches. Currently has a 3-week history of right buttock pain with some radiation to the lateral aspect of the right leg. The pain is on and off, decreased with rest, increased with standing and certain positions. No paresthesias. Reported episode of angioedema October 2019, that has not returned however he developed whelps typically at 6 PM most days.  Other than above, a 14 point review of systems is negative    Past Medical History:  Diagnosis Date  . GERD (gastroesophageal reflux disease)   . Positive PPD 1999   INH x 6 months  . PPD negative 2001    Past Surgical History:  Procedure Laterality Date  . NO PAST SURGERIES      Social History   Socioeconomic History  . Marital status: Married    Spouse name: Not on file  . Number of children: 1  . Years of education: Not on file  . Highest education level: Not on file  Occupational History  . Occupation: Economist  Social Needs  . Financial resource strain: Not on file  . Food insecurity:    Worry: Not on file    Inability: Not on file  . Transportation needs:    Medical: Not on file    Non-medical: Not on file  Tobacco Use  . Smoking status: Never Smoker  . Smokeless tobacco: Never Used  Substance and Sexual Activity  . Alcohol use: No  . Drug use: No  . Sexual activity: Yes    Birth control/protection: None  Lifestyle  . Physical activity:    Days per week: Not on file    Minutes per  session: Not on file  . Stress: Not on file  Relationships  . Social connections:    Talks on phone: Not on file    Gets together: Not on file    Attends religious service: Not on file    Active member of club or organization: Not on file    Attends meetings of clubs or organizations: Not on file    Relationship status: Not on file  . Intimate partner violence:    Fear of current or ex partner: Not on file    Emotionally abused: Not on file    Physically abused: Not on file    Forced sexual activity: Not on file  Other Topics Concern  . Not on file  Social History Narrative   From [place], wife from [place]    1 adopted son, adult, lives w/ them         Family History  Problem Relation Age of Onset  . Pancreatic cancer Sister   . Diabetes Neg Hx   . Colon cancer Neg Hx   . Hypertension Neg Hx   . Coronary artery disease Neg Hx   . Stroke Neg Hx   . Prostate cancer Neg Hx      Allergies as of 09/11/2018   No Known Allergies  Medication List       Accurate as of September 11, 2018 11:59 PM. Always use your most recent med list.        omeprazole 20 MG capsule Commonly known as:  PRILOSEC Take 1 capsule (20 mg total) by mouth daily.   OSTEO BI-FLEX REGULAR STRENGTH PO Take by mouth daily.           Objective:   Physical Exam BP 108/70 (BP Location: Left Arm, Patient Position: Sitting, Cuff Size: Normal)   Pulse (!) 52   Temp 98.4 F (36.9 C) (Oral)   Resp 16   Ht 5\' 9"  (1.753 m)   Wt 218 lb 8 oz (99.1 kg)   SpO2 98%   BMI 32.27 kg/m  General: Well developed, NAD, BMI noted Neck: No  thyromegaly  HEENT:  Normocephalic . Face symmetric, atraumatic Lungs:  CTA B Normal respiratory effort, no intercostal retractions, no accessory muscle use. Heart: RRR,  no murmur.  No pretibial edema bilaterally  Abdomen:  Not distended, soft, non-tender. No rebound or rigidity.   Skin: Exposed areas without rash. Not pale. Not jaundice Neurologic:  alert &  oriented X3.  Speech normal, gait appropriate for age and unassisted Strength symmetric and appropriate for age. DTR symmetric. Straight leg test: Question of + on the right side. Psych: Cognition and judgment appear intact.  Cooperative with normal attention span and concentration.  Behavior appropriate. No anxious or depressed appearing.     Assessment     Assessment Prediabetes (a1c 6.0  08/2016) GERD PPD +1999, s/p INH. PPD (-) 2001  PLAN:  Prediabetes: Check a A1c Radiculopathy?  Will recommend ibuprofen with GI precautions, Flexeril, watch for drowsiness. If not gradually better he will call for a referral. Angioedema: See phone note from 04/14/2018: Had an episode of angioedema, he was prescribed an EpiPen, and referred to an allergist. He still has some symptoms, see ROS.  Denies a history of a tick bite Has not seen the allergist just yet, another referral entered ASAP.  Risk of angioedema with severe consequences discussed. Encouraged to carry EpiPen everywhere. RTC 1 year

## 2018-09-13 LAB — TSH: TSH: 5.07 u[IU]/mL — ABNORMAL HIGH (ref 0.450–4.500)

## 2018-09-13 LAB — HEMOGLOBIN A1C
Est. average glucose Bld gHb Est-mCnc: 131 mg/dL
HEMOGLOBIN A1C: 6.2 % — AB (ref 4.8–5.6)

## 2018-09-13 LAB — COMPREHENSIVE METABOLIC PANEL
ALT: 33 IU/L (ref 0–44)
AST: 20 IU/L (ref 0–40)
Albumin/Globulin Ratio: 1.6 (ref 1.2–2.2)
Albumin: 4.6 g/dL (ref 3.8–4.9)
Alkaline Phosphatase: 81 IU/L (ref 39–117)
BUN/Creatinine Ratio: 13 (ref 9–20)
BUN: 16 mg/dL (ref 6–24)
Bilirubin Total: 0.3 mg/dL (ref 0.0–1.2)
CO2: 24 mmol/L (ref 20–29)
Calcium: 9.7 mg/dL (ref 8.7–10.2)
Chloride: 100 mmol/L (ref 96–106)
Creatinine, Ser: 1.19 mg/dL (ref 0.76–1.27)
GFR, EST AFRICAN AMERICAN: 81 mL/min/{1.73_m2} (ref >59)
GFR, EST NON AFRICAN AMERICAN: 70 mL/min/{1.73_m2} (ref >59)
Globulin, Total: 2.9 g/dL (ref 1.5–4.5)
Glucose: 102 mg/dL — ABNORMAL HIGH (ref 65–99)
Potassium: 4.6 mmol/L (ref 3.5–5.2)
Sodium: 141 mmol/L (ref 134–144)
TOTAL PROTEIN: 7.5 g/dL (ref 6.0–8.5)

## 2018-09-13 LAB — LIPID PANEL
Chol/HDL Ratio: 3.8 ratio (ref 0.0–5.0)
Cholesterol, Total: 147 mg/dL (ref 100–199)
HDL: 39 mg/dL — AB (ref >39)
LDL Calculated: 88 mg/dL (ref 0–99)
Triglycerides: 102 mg/dL (ref 0–149)
VLDL Cholesterol Cal: 20 mg/dL (ref 5–40)

## 2018-09-13 NOTE — Assessment & Plan Note (Signed)
Prediabetes: Check a A1c Radiculopathy?  Will recommend ibuprofen with GI precautions, Flexeril, watch for drowsiness. If not gradually better he will call for a referral. Angioedema: See phone note from 04/14/2018: Had an episode of angioedema, he was prescribed an EpiPen, and referred to an allergist. He still has some symptoms, see ROS.  Denies a history of a tick bite Has not seen the allergist just yet, another referral entered ASAP.  Risk of angioedema with severe consequences discussed. Encouraged to carry EpiPen everywhere. RTC 1 year

## 2019-02-01 IMAGING — CT CT HEAD W/O CM
3 series · 15 of 47 positions shown, 18 images · non-contrast
Comparison: None.

CLINICAL DATA: Headache, intractable

EXAM:
CT HEAD WITHOUT CONTRAST
TECHNIQUE: Contiguous axial images were obtained from the base of the skull
through the vertex without intravenous contrast.

[Series 2: head wo · axial · 0.41mm/px · z∈[-172,-47]mm · 9 of 31 slices shown, 12 images]
[im 3/31  brain]
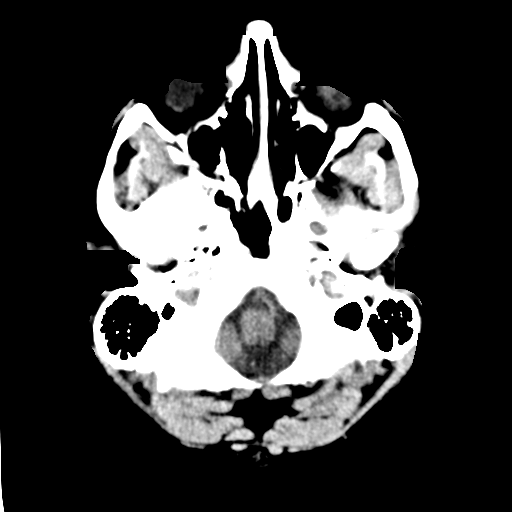
[im 3/31  bone]
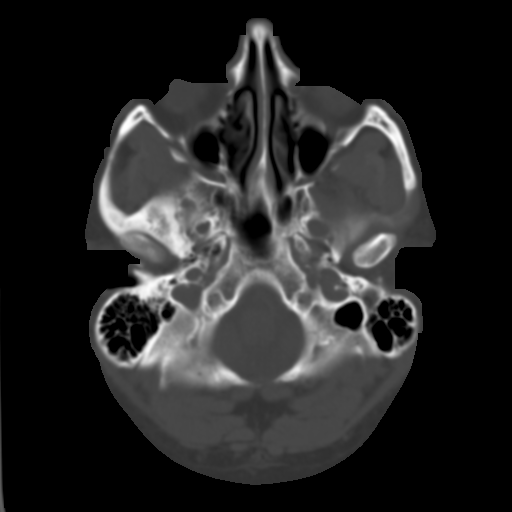
[im 6/31  brain]
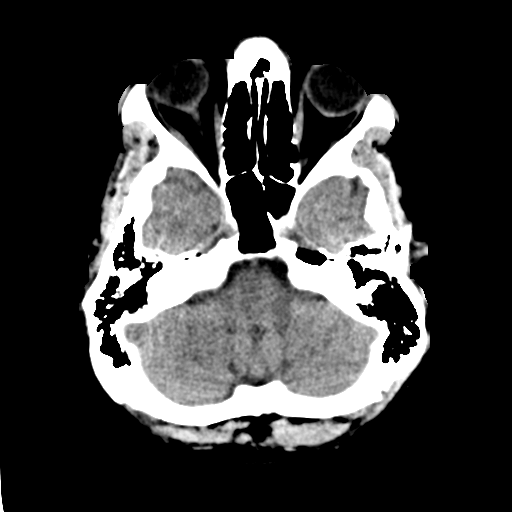
[im 9/31  brain]
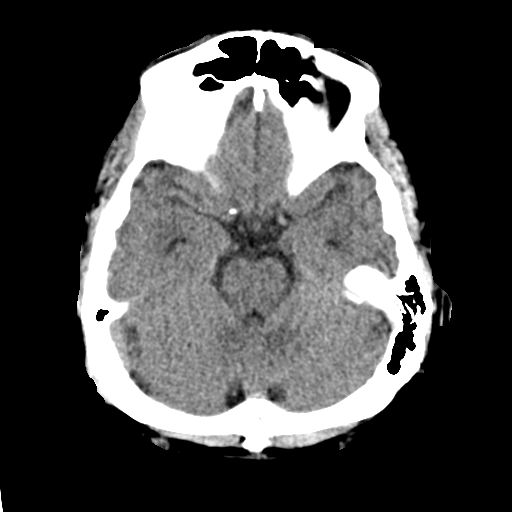
[im 12/31  brain]
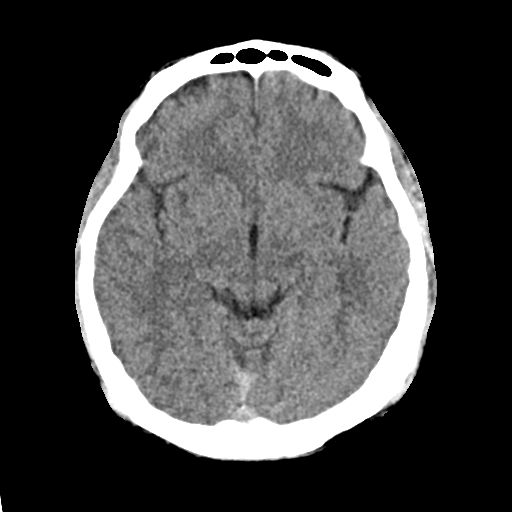
[im 16/31  brain]
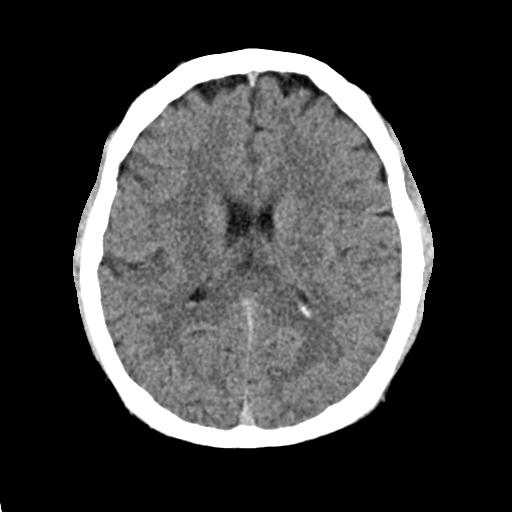
[im 16/31  bone]
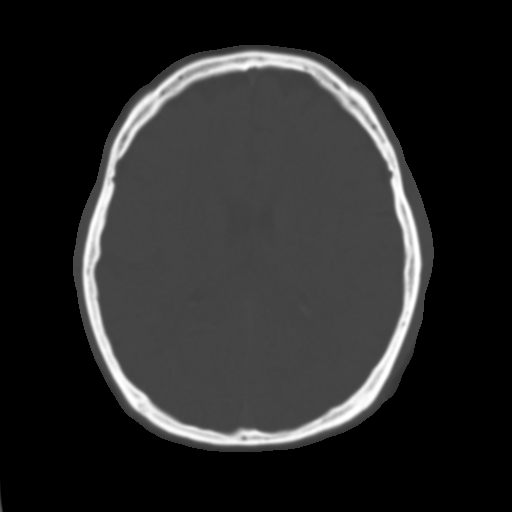
[im 19/31  brain]
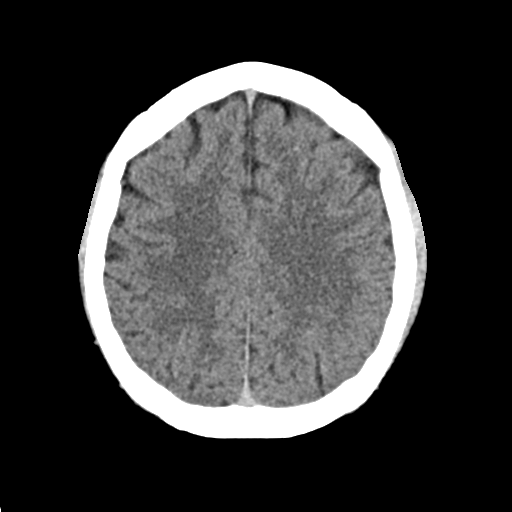
[im 22/31  brain]
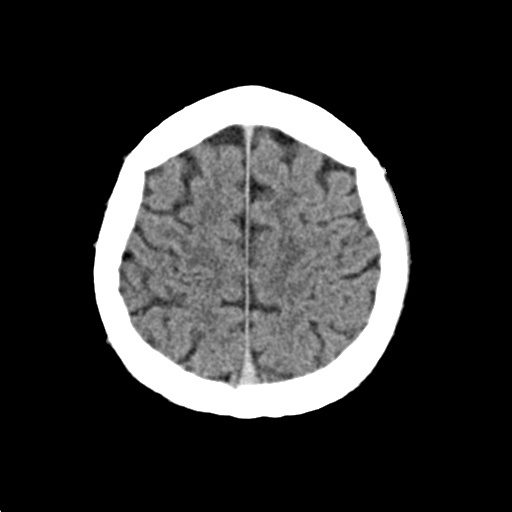
[im 25/31  brain]
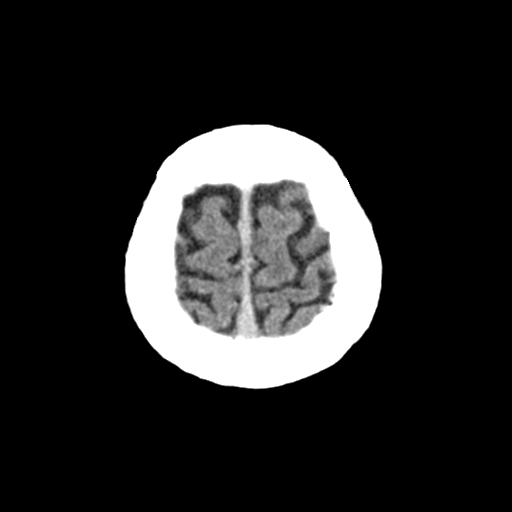
[im 28/31  brain]
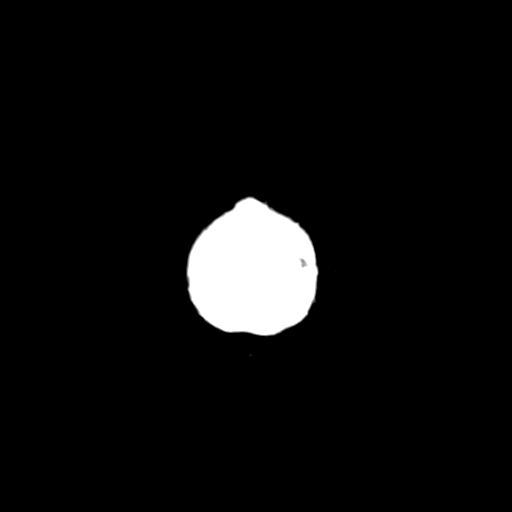
[im 28/31  bone]
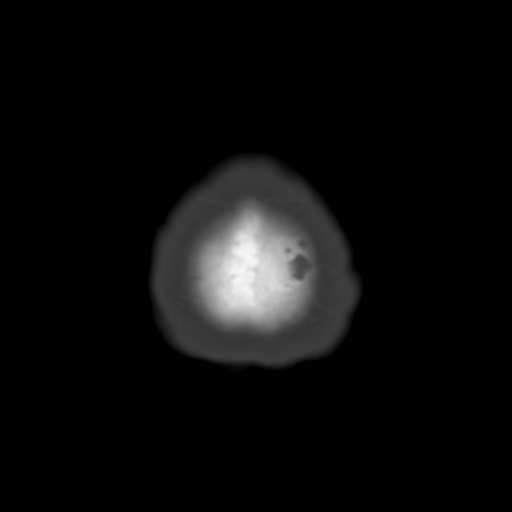

[Series 4: coronal soft · coronal · 0.30mm/px · 3 of 64 slices shown]
[im 22/64  brain]
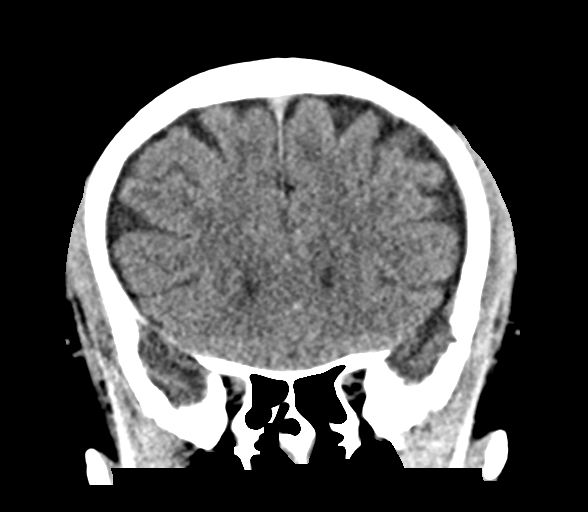
[im 29/64  brain]
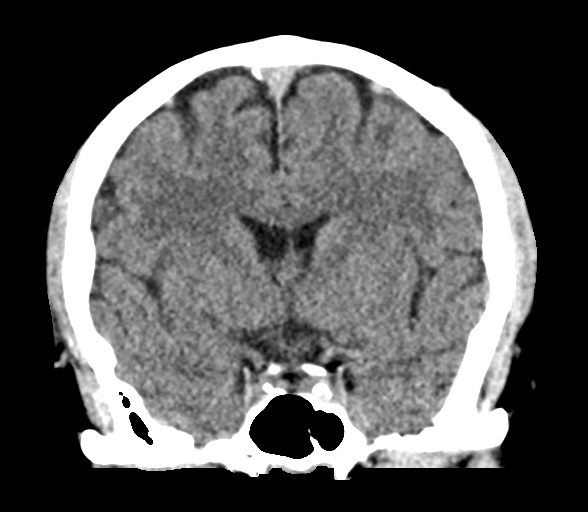
[im 36/64  brain]
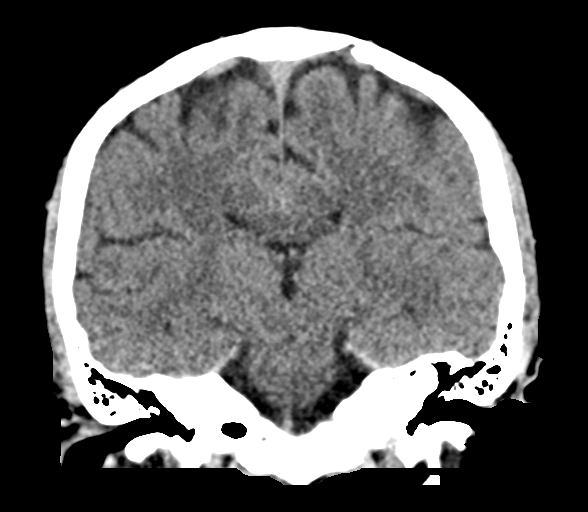

[Series 5: sag soft · sagittal · 0.29mm/px · 3 of 58 slices shown]
[im 20/58  brain]
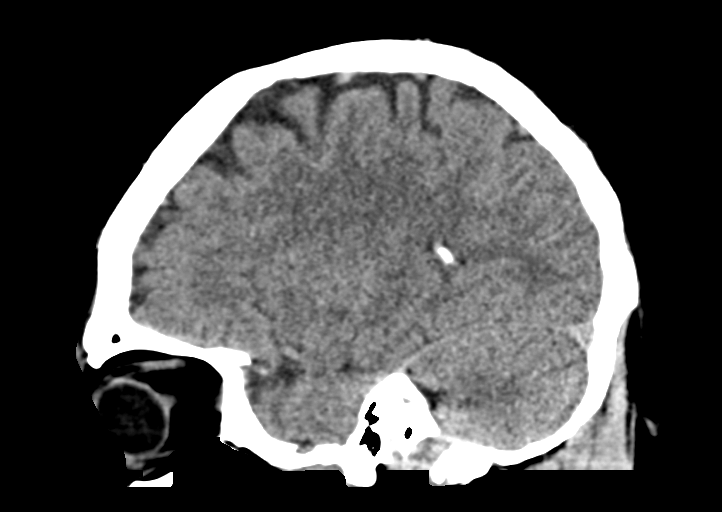
[im 29/58  brain]
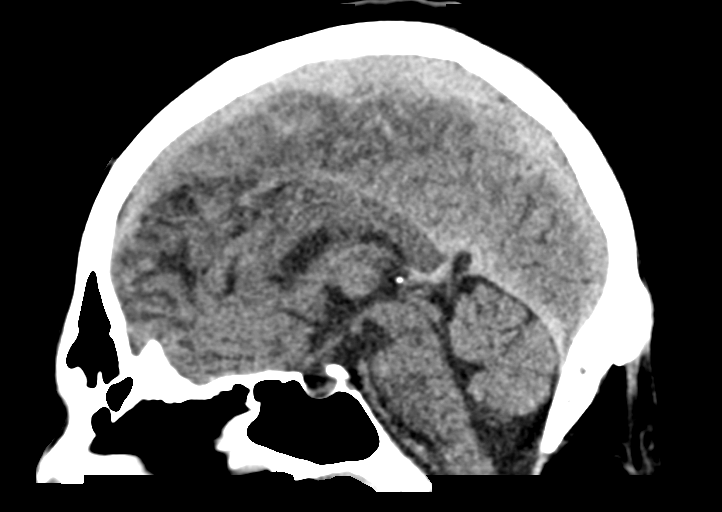
[im 39/58  brain]
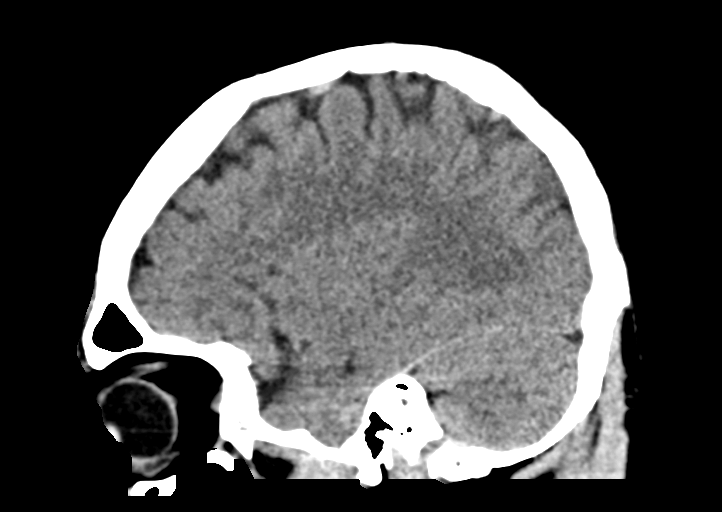

[15 of 47 positions shown; findings below may reference images not displayed]

FINDINGS: Brain: The ventricles are normal in size and configuration. The
right lateral ventricle is slightly larger than the left lateral
ventricle, an apparent anatomic variant. There is no intracranial
mass, hemorrhage, extra-axial fluid collection, or midline shift.
Gray-white compartments appear normal. No evident acute infarct.

Vascular: There is no hyperdense vessel. There it is slight
calcification in each carotid siphon region.

Skull: Bony calvarium appears intact.

Sinuses/Orbits: There is minimal mucosal thickening in the posterior
right maxillary antrum. There is slight mucosal thickening in
several ethmoid air cells. Other visualized paranasal sinuses are
clear. Orbits appear symmetric bilaterally.

Other: Visualized mastoid air cells are clear.
IMPRESSION: No intracranial mass, hemorrhage, or extra-axial fluid collection.
Gray-white compartments appear normal. Minimal paranasal sinus
disease noted. Rather minimal vascular calcification noted.

## 2019-04-14 ENCOUNTER — Encounter: Payer: Self-pay | Admitting: Internal Medicine

## 2019-04-14 DIAGNOSIS — M549 Dorsalgia, unspecified: Secondary | ICD-10-CM

## 2019-05-06 ENCOUNTER — Ambulatory Visit: Payer: Self-pay

## 2019-05-06 ENCOUNTER — Ambulatory Visit (INDEPENDENT_AMBULATORY_CARE_PROVIDER_SITE_OTHER): Payer: 59 | Admitting: Physical Medicine and Rehabilitation

## 2019-05-06 ENCOUNTER — Other Ambulatory Visit: Payer: Self-pay

## 2019-05-06 ENCOUNTER — Encounter: Payer: Self-pay | Admitting: Physical Medicine and Rehabilitation

## 2019-05-06 VITALS — BP 167/89 | HR 62 | Ht 71.0 in | Wt 215.0 lb

## 2019-05-06 DIAGNOSIS — M5441 Lumbago with sciatica, right side: Secondary | ICD-10-CM

## 2019-05-06 DIAGNOSIS — M4316 Spondylolisthesis, lumbar region: Secondary | ICD-10-CM | POA: Diagnosis not present

## 2019-05-06 DIAGNOSIS — M47816 Spondylosis without myelopathy or radiculopathy, lumbar region: Secondary | ICD-10-CM

## 2019-05-06 DIAGNOSIS — G8929 Other chronic pain: Secondary | ICD-10-CM

## 2019-05-06 DIAGNOSIS — M5416 Radiculopathy, lumbar region: Secondary | ICD-10-CM | POA: Diagnosis not present

## 2019-05-06 MED ORDER — BETAMETHASONE SOD PHOS & ACET 6 (3-3) MG/ML IJ SUSP
12.0000 mg | Freq: Once | INTRAMUSCULAR | Status: AC
  Administered 2019-05-06: 12 mg

## 2019-05-06 NOTE — Progress Notes (Signed)
ZALMAN GANSER - 53 y.o. male MRN [id number]  Date of birth: 08-16-1965  Office Visit Note: Visit Date: 05/06/2019 PCP: Colon Branch, MD Referred by: Colon Branch, MD  Subjective: Chief Complaint  Patient presents with   Lower Back - Pain   Right Leg - Pain   HPI: NOBORU MASOUD is a 53 y.o. male who comes in today At the request of his primary care physician Kathlene November, MD for consultation and treatment of chronic worsening low back pain now with several months of severe right radicular leg pain with paresthesia.  Patient tells me that a coworker of his has seen me in the past and that she recommend that he see me.  Everyone that he is talked he recently thinks he is having sciatica symptoms.  He describes since February of this year with no specific injury a severe 8 out of 10 pain in the right posterior buttock region that refers into the hamstring and then has paresthesias in a pretty classic S1 distribution to the lateral foot and he has tingling and numbness in the right fifth toe.  He denies any symptoms on the left side.  His symptoms are worse with standing and ambulating but also worse with sitting and standing.  Gets some relief with rest and laying down flat.  Pain is very sharp intermittently bad and does really affect his daily activities including work rest of stand and ambulate quite a bit at work.  He has not noted any foot drop or focal weakness.  He has had no red flag complaints of bowel or bladder issues.  Had thorough exam by his primary care physician who noted straight leg raise was positive on the right.  He has seen a chiropractor Dr. Jola Baptist in the past on multiple occasions and through this and has had gotten relief in the past but not recently.  He has gone through decompression type treatments with him as well as manipulation and other treatments without much relief now but it did help him in the past.  He is not any prior lumbar surgery.  He has not had  imaging of the lumbar spine.  Review of Systems  Constitutional: Negative for chills, fever, malaise/fatigue and weight loss.  HENT: Negative for hearing loss and sinus pain.   Eyes: Negative for blurred vision, double vision and photophobia.  Respiratory: Negative for cough and shortness of breath.   Cardiovascular: Negative for chest pain, palpitations and leg swelling.  Gastrointestinal: Negative for abdominal pain, nausea and vomiting.  Genitourinary: Negative for flank pain.  Musculoskeletal: Positive for back pain. Negative for myalgias.  Skin: Negative for itching and rash.  Neurological: Positive for tingling. Negative for tremors, focal weakness and weakness.  Endo/Heme/Allergies: Negative.   Psychiatric/Behavioral: Negative for depression.  All other systems reviewed and are negative.  Otherwise per HPI.  Assessment & Plan: Visit Diagnoses:  1. Radiculopathy, lumbar region   2. Chronic bilateral low back pain with right-sided sciatica   3. Spondylosis without myelopathy or radiculopathy, lumbar region   4. Spondylolisthesis of lumbar region     Plan: Findings:  Chronic history of low back pain with chiropractic treatments and off-and-on anti-inflammatory use.  He has x-ray imaging findings of facet arthritis as well as some small listhesis and degenerative changes in general and likely has had this chronically.  He now has almost 10 months of radicular leg pain on the right and a classic S1 distribution with paresthesia but no  weakness.  Exam consistent with radiculopathy.  I think at this point given the fact that he has had some much pain ongoing and nothing is really helped diagnostic S1 transforaminal injection could be performed we can do this today.  We are going to get MRI of the lumbar spine depending on his relief or lack thereof.  We also want him to start doing neural flossing exercises and I have given him some keywords to search on the Internet concerning his spine.   I think this is likely a combination of small disc protrusion facet arthritis with some narrowing.     Meds & Orders:  Meds ordered this encounter  Medications   betamethasone acetate-betamethasone sodium phosphate (CELESTONE) injection 12 mg    Orders Placed This Encounter  Procedures   XR Lumbar Spine Complete   XR C-ARM NO REPORT   Epidural Steroid injection    Follow-up: Return in about 4 weeks (around 06/03/2019) for Recheck spine.   Procedures: No procedures performed  S1 Lumbosacral Transforaminal Epidural Steroid Injection - Sub-Pedicular Approach with Fluoroscopic Guidance   Patient: GROVE SAVKO      Date of Birth: 04-18-1966 MRN: [id number] PCP: Colon Branch, MD      Visit Date: 05/06/2019   Universal Protocol:    Date/Time: 05/06/2011:25 PM  Consent Given By: the patient  Position:  PRONE  Additional Comments: Vital signs were monitored before and after the procedure. Patient was prepped and draped in the usual sterile fashion. The correct patient, procedure, and site was verified.   Injection Procedure Details:  Procedure Site One Meds Administered:  Meds ordered this encounter  Medications   betamethasone acetate-betamethasone sodium phosphate (CELESTONE) injection 12 mg    Laterality: Right  Location/Site:  S1 Foramen   Needle size: 22 ga.  Needle type: Spinal  Needle Placement: Transforaminal  Findings:   -Comments: Excellent flow of contrast along the nerve and into the epidural space.  Procedure Details: After squaring off the sacral end-plate to get a true AP view, the C-arm was positioned so that the best possible view of the S1 foramen was visualized. The soft tissues overlying this structure were infiltrated with 2-3 ml. of 1% Lidocaine without Epinephrine.    The spinal needle was inserted toward the target using a "trajectory" view along the fluoroscope beam.  Under AP and lateral visualization, the needle was advanced so  it did not puncture dura. Biplanar projections were used to confirm position. Aspiration was confirmed to be negative for CSF and/or blood. A 1-2 ml. volume of Isovue-250 was injected and flow of contrast was noted at each level. Radiographs were obtained for documentation purposes.   After attaining the desired flow of contrast documented above, a 0.5 to 1.0 ml test dose of 0.25% Marcaine was injected into each respective transforaminal space.  The patient was observed for 90 seconds post injection.  After no sensory deficits were reported, and normal lower extremity motor function was noted,   the above injectate was administered so that equal amounts of the injectate were placed at each foramen (level) into the transforaminal epidural space.   Additional Comments:  The patient tolerated the procedure well Dressing: Band-Aid with 2 x 2 sterile gauze    Post-procedure details: Patient was observed during the procedure. Post-procedure instructions were reviewed.  Patient left the clinic in stable condition.    Clinical History: No specialty comments available.   He reports that he has never smoked. He has never used smokeless  tobacco.  Recent Labs    09/11/18 1024  HGBA1C 6.2*    Objective:  VS:  HT:5\' 11"  (180.3 cm)    WT:215 lb (97.5 kg)   BMI:30     BP:(!) 167/89   HR:62bpm   TEMP: ( )   RESP:  Physical Exam Vitals signs and nursing note reviewed.  Constitutional:      General: He is not in acute distress.    Appearance: He is well-developed.  HENT:     Head: Normocephalic and atraumatic.     Nose: Nose normal.     Mouth/Throat:     Mouth: Mucous membranes are moist.     Pharynx: Oropharynx is clear.  Eyes:     Conjunctiva/sclera: Conjunctivae normal.     Pupils: Pupils are equal, round, and reactive to light.  Neck:     Musculoskeletal: Normal range of motion and neck supple.     Trachea: No tracheal deviation.  Cardiovascular:     Rate and Rhythm: Normal rate and  regular rhythm.     Pulses: Normal pulses.  Pulmonary:     Effort: Pulmonary effort is normal.     Breath sounds: Normal breath sounds.  Abdominal:     General: There is no distension.     Palpations: Abdomen is soft.     Tenderness: There is no guarding or rebound.  Musculoskeletal:        General: No deformity.     Right lower leg: No edema.     Left lower leg: No edema.     Comments: Patient ambulates without aid.  He has some pain with extension of the lumbar spine he has pain over the right PSIS but not greater trochanter.  No pain with hip rotation.  He has good strength in the lower extremities particularly with dorsiflexion plantarflexion EHL.  He does have a positive slump test on the right.  Skin:    General: Skin is warm and dry.     Findings: No erythema or rash.  Neurological:     General: No focal deficit present.     Mental Status: He is alert and oriented to person, place, and time.     Motor: No abnormal muscle tone.     Coordination: Coordination normal.     Gait: Gait normal.  Psychiatric:        Mood and Affect: Mood normal.        Behavior: Behavior normal.        Thought Content: Thought content normal.     Ortho Exam Imaging: Xr C-arm No Report  Result Date: 05/06/2019 Please see Notes tab for imaging impression.  Xr Lumbar Spine Complete  Result Date: 05/06/2019 4 view lumbar spine shows normally seated hips bilaterally without any degenerative joint disease well-maintained sacroiliac joints bilaterally no sclerosis and pelvic heights somewhat higher on the left than right.  Lumbar spine shows normal lordosis with some retrolisthesis of L3-L4 and  L4 on 5.  There is some degenerative disc height loss at L5-S1,  there is some anterior disc endplate spurring of the lower lumbar segments.  There is bilateral foraminal narrowing at L5-S1.  There is facet arthropathy at L3-4, L4-5 and L5-S1.  There are no pars defects noted.   Past  Medical/Family/Surgical/Social History: Medications & Allergies reviewed per EMR, new medications updated. Patient Active Problem List   Diagnosis Date Noted   PCP NOTES >>>>>>>>>>>>>> 10/25/2016   Annual physical exam 11/21/2010   LIPOMAS, MULTIPLE 09/27/2009  GERD 05/25/2008   Past Medical History:  Diagnosis Date   GERD (gastroesophageal reflux disease)    Positive PPD 1999   INH x 6 months   PPD negative 2001   Family History  Problem Relation Age of Onset   Pancreatic cancer Sister    Diabetes Neg Hx    Colon cancer Neg Hx    Hypertension Neg Hx    Coronary artery disease Neg Hx    Stroke Neg Hx    Prostate cancer Neg Hx    Past Surgical History:  Procedure Laterality Date   NO PAST SURGERIES     Social History   Occupational History   Occupation: Economist  Tobacco Use   Smoking status: Never Smoker   Smokeless tobacco: Never Used  Substance and Sexual Activity   Alcohol use: No   Drug use: No   Sexual activity: Yes    Birth control/protection: None

## 2019-05-06 NOTE — Progress Notes (Signed)
 .  Numeric Pain Rating Scale and Functional Assessment Average Pain 8 Pain Right Now 4 My pain is intermittent and sharp Pain is worse with: walking, standing and some activites Pain improves with: rest   In the last MONTH (on 0-10 scale) has pain interfered with the following?  1. General activity like being  able to carry out your everyday physical activities such as walking, climbing stairs, carrying groceries, or moving a chair?  Rating(8)  2. Relation with others like being able to carry out your usual social activities and roles such as  activities at home, at work and in your community. Rating(8)  3. Enjoyment of life such that you have  been bothered by emotional problems such as feeling anxious, depressed or irritable?  Rating(4)  +Driver, -BT, -Dye Allergy

## 2019-05-06 NOTE — Procedures (Signed)
S1 Lumbosacral Transforaminal Epidural Steroid Injection - Sub-Pedicular Approach with Fluoroscopic Guidance   Patient: Bryan Shaw      Date of Birth: 10/15/1965 MRN: [id number] PCP: Colon Branch, MD      Visit Date: 05/06/2019   Universal Protocol:    Date/Time: 05/06/2011:25 PM  Consent Given By: the patient  Position:  PRONE  Additional Comments: Vital signs were monitored before and after the procedure. Patient was prepped and draped in the usual sterile fashion. The correct patient, procedure, and site was verified.   Injection Procedure Details:  Procedure Site One Meds Administered:  Meds ordered this encounter  Medications  . betamethasone acetate-betamethasone sodium phosphate (CELESTONE) injection 12 mg    Laterality: Right  Location/Site:  S1 Foramen   Needle size: 22 ga.  Needle type: Spinal  Needle Placement: Transforaminal  Findings:   -Comments: Excellent flow of contrast along the nerve and into the epidural space.  Procedure Details: After squaring off the sacral end-plate to get a true AP view, the C-arm was positioned so that the best possible view of the S1 foramen was visualized. The soft tissues overlying this structure were infiltrated with 2-3 ml. of 1% Lidocaine without Epinephrine.    The spinal needle was inserted toward the target using a "trajectory" view along the fluoroscope beam.  Under AP and lateral visualization, the needle was advanced so it did not puncture dura. Biplanar projections were used to confirm position. Aspiration was confirmed to be negative for CSF and/or blood. A 1-2 ml. volume of Isovue-250 was injected and flow of contrast was noted at each level. Radiographs were obtained for documentation purposes.   After attaining the desired flow of contrast documented above, a 0.5 to 1.0 ml test dose of 0.25% Marcaine was injected into each respective transforaminal space.  The patient was observed for 90 seconds post  injection.  After no sensory deficits were reported, and normal lower extremity motor function was noted,   the above injectate was administered so that equal amounts of the injectate were placed at each foramen (level) into the transforaminal epidural space.   Additional Comments:  The patient tolerated the procedure well Dressing: Band-Aid with 2 x 2 sterile gauze    Post-procedure details: Patient was observed during the procedure. Post-procedure instructions were reviewed.  Patient left the clinic in stable condition.

## 2019-06-03 ENCOUNTER — Other Ambulatory Visit: Payer: Self-pay

## 2019-06-03 ENCOUNTER — Ambulatory Visit (INDEPENDENT_AMBULATORY_CARE_PROVIDER_SITE_OTHER): Payer: 59 | Admitting: Physical Medicine and Rehabilitation

## 2019-06-03 ENCOUNTER — Encounter: Payer: Self-pay | Admitting: Physical Medicine and Rehabilitation

## 2019-06-03 VITALS — BP 165/98 | HR 64 | Ht 70.0 in | Wt 215.0 lb

## 2019-06-03 DIAGNOSIS — M4316 Spondylolisthesis, lumbar region: Secondary | ICD-10-CM

## 2019-06-03 DIAGNOSIS — M5416 Radiculopathy, lumbar region: Secondary | ICD-10-CM

## 2019-06-03 NOTE — Progress Notes (Signed)
.  Numeric Pain Rating Scale and Functional Assessment Average Pain 1 Pain Right Now 0 My pain is intermittent and dull Pain is worse with: walking Pain improves with: injections   In the last MONTH (on 0-10 scale) has pain interfered with the following?  1. General activity like being  able to carry out your everyday physical activities such as walking, climbing stairs, carrying groceries, or moving a chair?  Rating(3)  2. Relation with others like being able to carry out your usual social activities and roles such as  activities at home, at work and in your community. Rating(3)  3. Enjoyment of life such that you have  been bothered by emotional problems such as feeling anxious, depressed or irritable?  Rating(0)

## 2019-06-03 NOTE — Progress Notes (Signed)
Bryan Shaw - 53 y.o. male MRN [id number]  Date of birth: 01/29/1966  Office Visit Note: Visit Date: 06/03/2019 PCP: Colon Branch, MD Referred by: Colon Branch, MD  Subjective: Chief Complaint  Patient presents with  . Right Leg - Follow-up   HPI: Bryan Shaw is a 53 y.o. male who comes in today For evaluation management of low back pain and history of radicular pain status post S1 transforaminal injection on 05/06/2019.  Patient had pretty clear S1 radicular symptoms and physical exam findings.  Pain was ongoing for a month status post chiropractic care and normal management with medication.  S1 epidural injection has given him almost 100% relief of his pain.  He had one episode for about 30 minutes where he had some pain after working at work and he rested for a minute and sat down and this subsided he has not had that since then.  In the mornings when he first gets up and is at the sink he can feel some pressure in the right buttock area but not the pain.  He is very pleased.  No focal weakness no red flag complaints otherwise doing okay.  Review of Systems  Musculoskeletal: Positive for back pain.  All other systems reviewed and are negative.  Otherwise per HPI.  Assessment & Plan: Visit Diagnoses:  1. Radiculopathy, lumbar region   2. Spondylolisthesis of lumbar region     Plan: Findings:  8 months of chronic worsening severe right S1 radicular leg pain now relieved after right S1 transforaminal epidural steroid injection on 05/06/2019 with almost 100% relief of his symptoms.  He has had one small recurrence but it was only for about 30 minutes and it was not very painful.  At this point we are going all watch and see how he does we talked about the natural history of what we suspect is probably focal disc protrusion along with some early arthritic changes of the lumbar spine.  Depending on length of time that he gets relief would look at repeating injection versus MRI of the  lumbar spine.  He has no red flag complaints.  He has been to chiropractic care with Dr. Jola Baptist and can continue that if you would like.  He will continue with home exercises and core strengthening.  We will see him back as needed at this point.    Meds & Orders: No orders of the defined types were placed in this encounter.  No orders of the defined types were placed in this encounter.   Follow-up: Return if symptoms worsen or fail to improve.   Procedures: No procedures performed  No notes on file   Clinical History: No specialty comments available.   He reports that he has never smoked. He has never used smokeless tobacco.  Recent Labs    09/11/18 1024  HGBA1C 6.2*    Objective:  VS:  HT:5\' 10"  (177.8 cm)   WT:215 lb (97.5 kg)  BMI:30.85    BP:(!) 165/98  HR:64bpm  TEMP: ( )  RESP:  Physical Exam Vitals signs and nursing note reviewed.  Constitutional:      General: He is not in acute distress.    Appearance: Normal appearance. He is well-developed.  HENT:     Head: Normocephalic and atraumatic.  Eyes:     Conjunctiva/sclera: Conjunctivae normal.     Pupils: Pupils are equal, round, and reactive to light.  Neck:     Musculoskeletal: Normal range of motion  and neck supple. No neck rigidity.  Cardiovascular:     Rate and Rhythm: Normal rate.     Pulses: Normal pulses.     Heart sounds: Normal heart sounds.  Pulmonary:     Effort: Pulmonary effort is normal. No respiratory distress.  Musculoskeletal:     Right lower leg: No edema.     Left lower leg: No edema.     Comments: Patient ambulates without aid with good distal strength.  Skin:    General: Skin is warm and dry.     Findings: No erythema or rash.  Neurological:     General: No focal deficit present.     Mental Status: He is alert and oriented to person, place, and time.     Sensory: No sensory deficit.     Coordination: Coordination normal.     Gait: Gait normal.  Psychiatric:        Mood  and Affect: Mood normal.        Behavior: Behavior normal.     Ortho Exam Imaging: No results found.  Past Medical/Family/Surgical/Social History: Medications & Allergies reviewed per EMR, new medications updated. Patient Active Problem List   Diagnosis Date Noted  . PCP NOTES >>>>>>>>>>>>>> 10/25/2016  . Annual physical exam 11/21/2010  . LIPOMAS, MULTIPLE 09/27/2009  . GERD 05/25/2008   Past Medical History:  Diagnosis Date  . GERD (gastroesophageal reflux disease)   . Positive PPD 1999   INH x 6 months  . PPD negative 2001   Family History  Problem Relation Age of Onset  . Pancreatic cancer Sister   . Diabetes Neg Hx   . Colon cancer Neg Hx   . Hypertension Neg Hx   . Coronary artery disease Neg Hx   . Stroke Neg Hx   . Prostate cancer Neg Hx    Past Surgical History:  Procedure Laterality Date  . NO PAST SURGERIES     Social History   Occupational History  . Occupation: Economist  Tobacco Use  . Smoking status: Never Smoker  . Smokeless tobacco: Never Used  Substance and Sexual Activity  . Alcohol use: No  . Drug use: No  . Sexual activity: Yes    Birth control/protection: None

## 2019-10-01 ENCOUNTER — Other Ambulatory Visit: Payer: Self-pay

## 2019-10-01 ENCOUNTER — Encounter: Payer: Self-pay | Admitting: Internal Medicine

## 2019-10-01 ENCOUNTER — Ambulatory Visit (INDEPENDENT_AMBULATORY_CARE_PROVIDER_SITE_OTHER): Payer: 59 | Admitting: Internal Medicine

## 2019-10-01 VITALS — BP 174/86 | HR 51 | Temp 97.7°F | Resp 16 | Ht 70.0 in | Wt 224.1 lb

## 2019-10-01 DIAGNOSIS — R739 Hyperglycemia, unspecified: Secondary | ICD-10-CM

## 2019-10-01 DIAGNOSIS — R7989 Other specified abnormal findings of blood chemistry: Secondary | ICD-10-CM

## 2019-10-01 DIAGNOSIS — Z0000 Encounter for general adult medical examination without abnormal findings: Secondary | ICD-10-CM

## 2019-10-01 DIAGNOSIS — I10 Essential (primary) hypertension: Secondary | ICD-10-CM

## 2019-10-01 LAB — LIPID PANEL
Cholesterol: 205 mg/dL — ABNORMAL HIGH (ref 0–200)
HDL: 41.3 mg/dL (ref >39.00)
LDL Cholesterol: 130 mg/dL — ABNORMAL HIGH (ref 0–99)
NonHDL: 163.8
Total CHOL/HDL Ratio: 5
Triglycerides: 169 mg/dL — ABNORMAL HIGH (ref 0.0–149.0)
VLDL: 33.8 mg/dL (ref 0.0–40.0)

## 2019-10-01 LAB — CBC WITH DIFFERENTIAL/PLATELET
Basophils Absolute: 0 10*3/uL (ref 0.0–0.1)
Basophils Relative: 0.4 % (ref 0.0–3.0)
Eosinophils Absolute: 0.3 10*3/uL (ref 0.0–0.7)
Eosinophils Relative: 3 % (ref 0.0–5.0)
HCT: 50.5 % (ref 39.0–52.0)
Hemoglobin: 16.8 g/dL (ref 13.0–17.0)
Lymphocytes Relative: 35.3 % (ref 12.0–46.0)
Lymphs Abs: 3.1 10*3/uL (ref 0.7–4.0)
MCHC: 33.3 g/dL (ref 30.0–36.0)
MCV: 86.3 fl (ref 78.0–100.0)
Monocytes Absolute: 0.6 10*3/uL (ref 0.1–1.0)
Monocytes Relative: 7.3 % (ref 3.0–12.0)
Neutro Abs: 4.7 10*3/uL (ref 1.4–7.7)
Neutrophils Relative %: 54 % (ref 43.0–77.0)
Platelets: 194 10*3/uL (ref 150.0–400.0)
RBC: 5.85 Mil/uL — ABNORMAL HIGH (ref 4.22–5.81)
RDW: 14.8 % (ref 11.5–15.5)
WBC: 8.8 10*3/uL (ref 4.0–10.5)

## 2019-10-01 LAB — COMPREHENSIVE METABOLIC PANEL
ALT: 51 U/L (ref 0–53)
AST: 26 U/L (ref 0–37)
Albumin: 4.5 g/dL (ref 3.5–5.2)
Alkaline Phosphatase: 80 U/L (ref 39–117)
BUN: 13 mg/dL (ref 6–23)
CO2: 29 mEq/L (ref 19–32)
Calcium: 9.5 mg/dL (ref 8.4–10.5)
Chloride: 104 mEq/L (ref 96–112)
Creatinine, Ser: 1.05 mg/dL (ref 0.40–1.50)
GFR: 74.13 mL/min (ref >60.00)
Glucose, Bld: 100 mg/dL — ABNORMAL HIGH (ref 70–99)
Potassium: 4.4 mEq/L (ref 3.5–5.1)
Sodium: 139 mEq/L (ref 135–145)
Total Bilirubin: 0.5 mg/dL (ref 0.2–1.2)
Total Protein: 7.4 g/dL (ref 6.0–8.3)

## 2019-10-01 LAB — TSH: TSH: 4.48 u[IU]/mL (ref 0.35–4.50)

## 2019-10-01 LAB — HEMOGLOBIN A1C: Hgb A1c MFr Bld: 5.8 % (ref 4.6–6.5)

## 2019-10-01 LAB — T4, FREE: Free T4: 0.85 ng/dL (ref 0.60–1.60)

## 2019-10-01 LAB — PSA: PSA: 0.48 ng/mL (ref 0.10–4.00)

## 2019-10-01 MED ORDER — LOSARTAN POTASSIUM-HCTZ 50-12.5 MG PO TABS: 1.0000 | ORAL_TABLET | Freq: Every day | ORAL | 0 refills | Status: DC

## 2019-10-01 NOTE — Progress Notes (Addendum)
Subjective:    Patient ID: Bryan Shaw, male    DOB: 1965/12/17, 54 y.o.   MRN: [id number]  DOS:  10/01/2019 Type of visit - description: CPX Doing well, has no concerns.  BP Readings from Last 3 Encounters:  10/01/19 (!) 174/86  06/03/19 (!) 165/98  05/06/19 (!) 167/89     Review of Systems  A 14 point review of systems is negative    Past Medical History:  Diagnosis Date  . GERD (gastroesophageal reflux disease)   . Positive PPD 1999   INH x 6 months  . PPD negative 2001    Past Surgical History:  Procedure Laterality Date  . NO PAST SURGERIES     Family History  Problem Relation Age of Onset  . Pancreatic cancer Sister   . Diabetes Neg Hx   . Colon cancer Neg Hx   . Hypertension Neg Hx   . Coronary artery disease Neg Hx   . Stroke Neg Hx   . Prostate cancer Neg Hx      Allergies as of 10/01/2019   No Known Allergies     Medication List       Accurate as of October 01, 2019 11:59 PM. If you have any questions, ask your nurse or doctor.        STOP taking these medications   OSTEO BI-FLEX REGULAR STRENGTH PO Stopped by: Kathlene November, MD     TAKE these medications   losartan-hydrochlorothiazide 50-12.5 MG tablet Commonly known as: HYZAAR Take 1 tablet by mouth daily. Started by: Kathlene November, MD   multivitamin with minerals tablet Take 1 tablet by mouth daily.   omeprazole 20 MG capsule Commonly known as: PRILOSEC Take 1 capsule (20 mg total) by mouth daily.          Objective:   Physical Exam BP (!) 174/86 (BP Location: Right Arm, Patient Position: Sitting, Cuff Size: Normal)   Pulse (!) 51   Temp 97.7 F (36.5 C) (Temporal)   Resp 16   Ht 5\' 10"  (1.778 m)   Wt 224 lb 2 oz (101.7 kg)   SpO2 97%   BMI 32.16 kg/m   General: Well developed, NAD, BMI noted Neck: No  thyromegaly  HEENT:  Normocephalic . Face symmetric, atraumatic Lungs:  CTA B Normal respiratory effort, no intercostal retractions, no accessory muscle use. Heart:  RRR,  no murmur.  Abdomen:  Not distended, soft, non-tender. No rebound or rigidity.   Lower extremities: no pretibial edema bilaterally DRE: Normal sphincter tone, no stools, prostate normal Skin: Exposed areas without rash. Not pale. Not jaundice Neurologic:  alert & oriented X3.  Speech normal, gait appropriate for age and unassisted Strength symmetric and appropriate for age.  Psych: Cognition and judgment appear intact.  Cooperative with normal attention span and concentration.  Behavior appropriate. No anxious or depressed appearing.     Assessment    Assessment Prediabetes (a1c 6.0  08/2016) GERD PPD +1999, s/p INH. PPD (-) 2001  PLAN:  Here for CPX Prediabetes: Check A1c GERD: Takes omeprazole as needed HTN: BP has been elevated in 3 readings, see HPI, I recheck BP at the right arm 155/90. EKG today: Sinus bradycardia. CMP today and BMP in 2 weeks Start low-dose losartan HCT Monitor BPs at home. Radiculopathy: Since the last visit, saw Dr. Ernestina Patches twice, symptoms improved after S1 transforaminal epidural injection RTC for labs 2 weeks RTC OV 3 months   This visit occurred during the SARS-CoV-2 public health emergency.  Safety protocols were in place, including screening questions prior to the visit, additional usage of staff PPE, and extensive cleaning of exam room while observing appropriate contact time as indicated for disinfecting solutions.

## 2019-10-01 NOTE — Progress Notes (Signed)
mPre visit review using our clinic review tool, if applicable. No additional management support is needed unless otherwise documented below in the visit note.

## 2019-10-01 NOTE — Patient Instructions (Addendum)
Start taking losartan HCT 1 tablet daily  Check your blood pressure 2 times a week BP GOAL is between 110/65 and  135/85. If it is consistently higher or lower, let me know  Watch the salt intake  GO TO THE LAB : Get the blood work     [place], please reschedule your appointments Come back for blood work again in 2 weeks  Come back for a checkup in 3 months

## 2019-10-04 NOTE — Assessment & Plan Note (Signed)
-  Tdap 2019 - Covid  Shot recommended and pending -[facility]: Colonoscopy 10/2017, next per GI -prostate ca: DRE normal today, asymptomatic, check PSA -Diet-exercise: Discussed. Labs:  CMP, FLP, CBC, A1c, TSH, T4, PSA

## 2019-10-04 NOTE — Assessment & Plan Note (Signed)
Here for CPX Prediabetes: Check A1c GERD: Takes omeprazole as needed HTN: BP has been elevated in 3 readings, see HPI, I recheck BP at the right arm 155/90. EKG today: Sinus bradycardia. CMP today and BMP in 2 weeks Start low-dose losartan HCT Monitor BPs at home. Radiculopathy: Since the last visit, saw Dr. Ernestina Patches twice, symptoms improved after S1 transforaminal epidural injection RTC for labs 2 weeks RTC OV 3 months

## 2019-10-15 ENCOUNTER — Other Ambulatory Visit (INDEPENDENT_AMBULATORY_CARE_PROVIDER_SITE_OTHER): Payer: 59

## 2019-10-15 ENCOUNTER — Other Ambulatory Visit: Payer: Self-pay

## 2019-10-15 DIAGNOSIS — I10 Essential (primary) hypertension: Secondary | ICD-10-CM

## 2019-10-15 LAB — BASIC METABOLIC PANEL
BUN: 16 mg/dL (ref 6–23)
CO2: 29 mEq/L (ref 19–32)
Calcium: 9.4 mg/dL (ref 8.4–10.5)
Chloride: 102 mEq/L (ref 96–112)
Creatinine, Ser: 1.1 mg/dL (ref 0.40–1.50)
GFR: 70.24 mL/min (ref >60.00)
Glucose, Bld: 109 mg/dL — ABNORMAL HIGH (ref 70–99)
Potassium: 4.6 mEq/L (ref 3.5–5.1)
Sodium: 137 mEq/L (ref 135–145)

## 2019-10-15 MED ORDER — LOSARTAN POTASSIUM-HCTZ 50-12.5 MG PO TABS: 1.0000 | ORAL_TABLET | Freq: Every day | ORAL | 4 refills | Status: DC

## 2019-10-15 NOTE — Addendum Note (Signed)
Addended byDamita Dunnings D on: 10/15/2019 02:50 PM   Modules accepted: Orders

## 2019-12-31 ENCOUNTER — Encounter: Payer: Self-pay | Admitting: Internal Medicine

## 2019-12-31 ENCOUNTER — Ambulatory Visit (INDEPENDENT_AMBULATORY_CARE_PROVIDER_SITE_OTHER): Payer: 59 | Admitting: Internal Medicine

## 2019-12-31 ENCOUNTER — Other Ambulatory Visit: Payer: Self-pay

## 2019-12-31 VITALS — BP 146/83 | HR 58 | Temp 97.3°F | Resp 18 | Ht 70.0 in | Wt 225.4 lb

## 2019-12-31 DIAGNOSIS — I10 Essential (primary) hypertension: Secondary | ICD-10-CM | POA: Diagnosis not present

## 2019-12-31 HISTORY — DX: Essential (primary) hypertension: I10

## 2019-12-31 MED ORDER — AMLODIPINE BESYLATE 5 MG PO TABS: 5.0000 mg | ORAL_TABLET | Freq: Every day | ORAL | 6 refills | Status: DC

## 2019-12-31 MED ORDER — LOSARTAN POTASSIUM-HCTZ 50-12.5 MG PO TABS: 1.0000 | ORAL_TABLET | Freq: Every day | ORAL | 6 refills | Status: DC

## 2019-12-31 NOTE — Patient Instructions (Addendum)
Check the  blood pressure weekly  BP GOAL is between 110/65 and  135/85. If it is consistently higher or lower, let me know  Add amlodipine 5 mg 1 at night   GO TO THE FRONT DESK, [place] back for a check up in 4 months

## 2019-12-31 NOTE — Progress Notes (Signed)
Pre visit review using our clinic review tool, if applicable. No additional management support is needed unless otherwise documented below in the visit note. 

## 2019-12-31 NOTE — Progress Notes (Signed)
   Subjective:    Patient ID: LAVONTE PALOS, male    DOB: 1965/11/15, 54 y.o.   MRN: [id number]  DOS:  12/31/2019 Type of visit - description: Follow-up Since the last office visit started Hyzaar.  No ambulatory BPs. Trying to do better with lifestyle.  Few weeks ago, developed a sudden onset of left ankle swelling, pain, redness. Lasted 4 days. There was no injury.   BP Readings from Last 3 Encounters:  12/31/19 (!) 146/83  10/01/19 (!) 174/86  06/03/19 (!) 165/98    Wt Readings from Last 3 Encounters:  12/31/19 225 lb 6 oz (102.2 kg)  10/01/19 224 lb 2 oz (101.7 kg)  06/03/19 215 lb (97.5 kg)     Review of Systems See above   Past Medical History:  Diagnosis Date  . GERD (gastroesophageal reflux disease)   . Positive PPD 1999   INH x 6 months  . PPD negative 2001    Past Surgical History:  Procedure Laterality Date  . NO PAST SURGERIES      Allergies as of 12/31/2019   No Known Allergies     Medication List       Accurate as of December 31, 2019 10:10 AM. If you have any questions, ask your nurse or doctor.        losartan-hydrochlorothiazide 50-12.5 MG tablet Commonly known as: HYZAAR Take 1 tablet by mouth daily.   multivitamin with minerals tablet Take 1 tablet by mouth daily.   omeprazole 20 MG capsule Commonly known as: PRILOSEC Take 1 capsule (20 mg total) by mouth daily.          Objective:   Physical Exam BP (!) 146/83 (BP Location: Left Arm, Patient Position: Sitting, Cuff Size: Normal)   Pulse (!) 58   Temp (!) 97.3 F (36.3 C) (Temporal)   Resp 18   Ht 5\' 10"  (1.778 m)   Wt 225 lb 6 oz (102.2 kg)   SpO2 98%   BMI 32.34 kg/m  General:   Well developed, NAD, BMI noted. HEENT:  Normocephalic . Face symmetric, atraumatic Lungs:  CTA B Normal respiratory effort, no intercostal retractions, no accessory muscle use. Heart: RRR,  no murmur.  Lower extremities: no pretibial edema bilaterally  Skin: Not pale. Not  jaundice Neurologic:  alert & oriented X3.  Speech normal, gait appropriate for age and unassisted Psych--  Cognition and judgment appear intact.  Cooperative with normal attention span and concentration.  Behavior appropriate. No anxious or depressed appearing.      Assessment     Assessment HTN (started med 10/2019) Prediabetes (a1c 6.0  08/2016) GERD PPD +1999, s/p INH. PPD (-) 2001  PLAN:  HTN: Started Hyzaar 10-2019, follow-up BMP very good, no ambulatory BPs.  BP today slightly elevated upon arrival, I rechecked manually: 150/80. We talk about continue same meds and  Improve even more his lifestyle but at this point he thinks can't do better thus will add amlodipine 5 mg nightly, see AVS. Extensive discussion about diet, exercise, and the role of genetics on HTN. Gout?  Had a sudden onset of swelling and pain of the left ankle, gout?  Avoid increasing Hyzaar, adding amlodipine for BP control. RTC 4 months.   This visit occurred during the SARS-CoV-2 public health emergency.  Safety protocols were in place, including screening questions prior to the visit, additional usage of staff PPE, and extensive cleaning of exam room while observing appropriate contact time as indicated for disinfecting solutions.

## 2019-12-31 NOTE — Assessment & Plan Note (Signed)
HTN: Started Hyzaar 10-2019, follow-up BMP very good, no ambulatory BPs.  BP today slightly elevated upon arrival, I rechecked manually: 150/80. We talk about continue same meds and  Improve even more his lifestyle but at this point he thinks can't do better thus will add amlodipine 5 mg nightly, see AVS. Extensive discussion about diet, exercise, and the role of genetics on HTN. Gout?  Had a sudden onset of swelling and pain of the left ankle, gout?  Avoid increasing Hyzaar, adding amlodipine for BP control. RTC 4 months.

## 2020-05-04 ENCOUNTER — Other Ambulatory Visit: Payer: Self-pay

## 2020-05-04 ENCOUNTER — Ambulatory Visit (INDEPENDENT_AMBULATORY_CARE_PROVIDER_SITE_OTHER): Payer: 59 | Admitting: Internal Medicine

## 2020-05-04 ENCOUNTER — Encounter: Payer: Self-pay | Admitting: Internal Medicine

## 2020-05-04 VITALS — BP 129/80 | HR 55 | Temp 97.5°F | Resp 16 | Ht 70.0 in | Wt 222.0 lb

## 2020-05-04 DIAGNOSIS — Z23 Encounter for immunization: Secondary | ICD-10-CM

## 2020-05-04 DIAGNOSIS — I10 Essential (primary) hypertension: Secondary | ICD-10-CM

## 2020-05-04 NOTE — Patient Instructions (Addendum)
Check the  blood pressure 2 or 3 times a month   BP GOAL is between 110/65 and  135/85. If it is consistently higher or lower, let me know   Get the Covid vaccine booster at your earliest convenience at your local pharmacy   [place], [place] back for   a physical exam by April 2022

## 2020-05-04 NOTE — Progress Notes (Signed)
   Subjective:    Patient ID: Bryan Shaw, male    DOB: 09-Oct-1965, 54 y.o.   MRN: [id number]  DOS:  05/04/2020 Type of visit - description: Follow-up Today we talk about high blood pressure, GERD and vaccinations. In general feels well. Not taking amlodipine. Very good ambulatory BPs  Wt Readings from Last 3 Encounters:  05/04/20 222 lb (100.7 kg)  12/31/19 225 lb 6 oz (102.2 kg)  10/01/19 224 lb 2 oz (101.7 kg)     Review of Systems Has lost some weight, reports he is doing well with diet. He remains active at work Past Medical History:  Diagnosis Date  . GERD (gastroesophageal reflux disease)   . Positive PPD 1999   INH x 6 months  . PPD negative 2001    Past Surgical History:  Procedure Laterality Date  . NO PAST SURGERIES      Allergies as of 05/04/2020   No Known Allergies     Medication List       Accurate as of May 04, 2020 11:59 PM. If you have any questions, ask your nurse or doctor.        STOP taking these medications   amLODipine 5 MG tablet Commonly known as: NORVASC Stopped by: Kathlene November, MD   multivitamin with minerals tablet Stopped by: Kathlene November, MD     TAKE these medications   losartan-hydrochlorothiazide 50-12.5 MG tablet Commonly known as: HYZAAR Take 1 tablet by mouth daily.   omeprazole 20 MG capsule Commonly known as: PRILOSEC Take 1 capsule (20 mg total) by mouth daily.          Objective:   Physical Exam BP 129/80 (BP Location: Left Arm, Patient Position: Sitting, Cuff Size: Normal)   Pulse (!) 55   Temp (!) 97.5 F (36.4 C) (Oral)   Resp 16   Ht 5\' 10"  (1.778 m)   Wt 222 lb (100.7 kg)   SpO2 96%   BMI 31.85 kg/m  General:   Well developed, NAD, BMI noted. HEENT:  Normocephalic . Face symmetric, atraumatic Neck: No thyromegaly Lungs:  CTA B Normal respiratory effort, no intercostal retractions, no accessory muscle use. Heart: RRR,  no murmur.  Lower extremities: no pretibial edema bilaterally   Skin: Not pale. Not jaundice Neurologic:  alert & oriented X3.  Speech normal, gait appropriate for age and unassisted Psych--  Cognition and judgment appear intact.  Cooperative with normal attention span and concentration.  Behavior appropriate. No anxious or depressed appearing.      Assessment     Assessment HTN (started med 10/2019) Prediabetes (a1c 6.0  08/2016) GERD PPD +1999, s/p INH. PPD (-) 2001  PLAN:  HTN: On Hyzaar, amlodipine was added at the last visit, took it for few days, denies side effects to me, he simply stopped taking it.  Ambulatory BPs typically 120/80.  Plan: Continue Hyzaar, restart amlodipine only if needed.  Doing well with lifestyle. GERD: On PPIs as needed only, has changed his diet and that has helped. Preventive care: COVID vaccine booster encourage He agreed to have a flu shot, benefits discussed. RTC CPX 09-2020   This visit occurred during the SARS-CoV-2 public health emergency.  Safety protocols were in place, including screening questions prior to the visit, additional usage of staff PPE, and extensive cleaning of exam room while observing appropriate contact time as indicated for disinfecting solutions.

## 2020-05-04 NOTE — Progress Notes (Signed)
Pre visit review using our clinic review tool, if applicable. No additional management support is needed unless otherwise documented below in the visit note. 

## 2020-05-05 NOTE — Assessment & Plan Note (Signed)
HTN: On Hyzaar, amlodipine was added at the last visit, took it for few days, denies side effects to me, he simply stopped taking it.  Ambulatory BPs typically 120/80.  Plan: Continue Hyzaar, restart amlodipine only if needed.  Doing well with lifestyle. GERD: On PPIs as needed only, has changed his diet and that has helped. Preventive care: COVID vaccine booster encourage He agreed to have a flu shot, benefits discussed. RTC CPX 09-2020

## 2020-07-17 ENCOUNTER — Other Ambulatory Visit: Payer: Self-pay | Admitting: Internal Medicine

## 2020-10-05 ENCOUNTER — Encounter: Payer: 59 | Admitting: Internal Medicine

## 2020-10-13 ENCOUNTER — Encounter: Payer: Self-pay | Admitting: Internal Medicine

## 2020-10-13 ENCOUNTER — Ambulatory Visit (INDEPENDENT_AMBULATORY_CARE_PROVIDER_SITE_OTHER): Payer: 59 | Admitting: Internal Medicine

## 2020-10-13 ENCOUNTER — Other Ambulatory Visit: Payer: Self-pay

## 2020-10-13 VITALS — BP 118/80 | HR 59 | Temp 98.3°F | Resp 16 | Ht 70.0 in | Wt 226.4 lb

## 2020-10-13 DIAGNOSIS — Z23 Encounter for immunization: Secondary | ICD-10-CM | POA: Diagnosis not present

## 2020-10-13 DIAGNOSIS — R7989 Other specified abnormal findings of blood chemistry: Secondary | ICD-10-CM

## 2020-10-13 DIAGNOSIS — Z1159 Encounter for screening for other viral diseases: Secondary | ICD-10-CM

## 2020-10-13 DIAGNOSIS — Z0000 Encounter for general adult medical examination without abnormal findings: Secondary | ICD-10-CM

## 2020-10-13 DIAGNOSIS — R739 Hyperglycemia, unspecified: Secondary | ICD-10-CM | POA: Diagnosis not present

## 2020-10-13 LAB — COMPREHENSIVE METABOLIC PANEL
ALT: 28 U/L (ref 0–53)
AST: 19 U/L (ref 0–37)
Albumin: 4 g/dL (ref 3.5–5.2)
Alkaline Phosphatase: 76 U/L (ref 39–117)
BUN: 14 mg/dL (ref 6–23)
CO2: 31 mEq/L (ref 19–32)
Calcium: 10.1 mg/dL (ref 8.4–10.5)
Chloride: 102 mEq/L (ref 96–112)
Creatinine, Ser: 1.07 mg/dL (ref 0.40–1.50)
GFR: 79.41 mL/min (ref >60.00)
Glucose, Bld: 115 mg/dL — ABNORMAL HIGH (ref 70–99)
Potassium: 4.6 mEq/L (ref 3.5–5.1)
Sodium: 140 mEq/L (ref 135–145)
Total Bilirubin: 0.5 mg/dL (ref 0.2–1.2)
Total Protein: 7.5 g/dL (ref 6.0–8.3)

## 2020-10-13 LAB — CBC WITH DIFFERENTIAL/PLATELET
Basophils Absolute: 0.1 10*3/uL (ref 0.0–0.1)
Basophils Relative: 0.7 % (ref 0.0–3.0)
Eosinophils Absolute: 0.2 10*3/uL (ref 0.0–0.7)
Eosinophils Relative: 2.4 % (ref 0.0–5.0)
HCT: 47.2 % (ref 39.0–52.0)
Hemoglobin: 15.6 g/dL (ref 13.0–17.0)
Lymphocytes Relative: 34.9 % (ref 12.0–46.0)
Lymphs Abs: 3.1 10*3/uL (ref 0.7–4.0)
MCHC: 33.1 g/dL (ref 30.0–36.0)
MCV: 85.6 fl (ref 78.0–100.0)
Monocytes Absolute: 0.7 10*3/uL (ref 0.1–1.0)
Monocytes Relative: 7.7 % (ref 3.0–12.0)
Neutro Abs: 4.9 10*3/uL (ref 1.4–7.7)
Neutrophils Relative %: 54.3 % (ref 43.0–77.0)
Platelets: 214 10*3/uL (ref 150.0–400.0)
RBC: 5.51 Mil/uL (ref 4.22–5.81)
RDW: 15.2 % (ref 11.5–15.5)
WBC: 9 10*3/uL (ref 4.0–10.5)

## 2020-10-13 LAB — LIPID PANEL
Cholesterol: 178 mg/dL (ref 0–200)
HDL: 40.3 mg/dL (ref >39.00)
LDL Cholesterol: 107 mg/dL — ABNORMAL HIGH (ref 0–99)
NonHDL: 137.69
Total CHOL/HDL Ratio: 4
Triglycerides: 153 mg/dL — ABNORMAL HIGH (ref 0.0–149.0)
VLDL: 30.6 mg/dL (ref 0.0–40.0)

## 2020-10-13 LAB — TSH: TSH: 3.85 u[IU]/mL (ref 0.35–4.50)

## 2020-10-13 LAB — HEMOGLOBIN A1C: Hgb A1c MFr Bld: 6.9 % — ABNORMAL HIGH (ref 4.6–6.5)

## 2020-10-13 LAB — T4, FREE: Free T4: 0.73 ng/dL (ref 0.60–1.60)

## 2020-10-13 NOTE — Patient Instructions (Signed)
Check the  blood pressure regularly.  BP GOAL is between 110/65 and  135/85. If it is consistently higher or lower, let me know  Come back in 2 to 3 months to get your shingles   booster  GO TO THE LAB : Get the blood work     [place], [place] back for Shingrix booster in 2 to 3 months  Come back for physical exam in 1 year.  Sooner if needed

## 2020-10-13 NOTE — Progress Notes (Signed)
   Subjective:    Patient ID: Bryan Shaw, male    DOB: [birth year], 55 y.o.   MRN: [id number]  DOS:  10/13/2020 Type of visit - description: CPX Since the last office visit he is doing well and has no major concerns. Occasional leg cramps at the left leg, that happened 3 months ago, now is better after he started to drink plenty of fluids.   Review of Systems  Other than above, a 14 point review of systems is negative      Past Medical History:  Diagnosis Date  . Essential hypertension 12/31/2019  . GERD (gastroesophageal reflux disease)   . Positive PPD 1999   INH x 6 months  . PPD negative 2001    Past Surgical History:  Procedure Laterality Date  . NO PAST SURGERIES      Allergies as of 10/13/2020   No Known Allergies     Medication List       Accurate as of October 13, 2020 11:59 PM. If you have any questions, ask your nurse or doctor.        losartan-hydrochlorothiazide 50-12.5 MG tablet Commonly known as: HYZAAR Take 1 tablet by mouth daily.   OMEGA 3-6-9 COMPLEX PO Take by mouth.   omeprazole 20 MG capsule Commonly known as: PRILOSEC Take 1 capsule (20 mg total) by mouth daily.          Objective:   Physical Exam BP 118/80 (BP Location: Left Arm, Patient Position: Sitting, Cuff Size: Normal)   Pulse (!) 59   Temp 98.3 F (36.8 C) (Oral)   Resp 16   Ht 5\' 10"  (1.778 m)   Wt 226 lb 6 oz (102.7 kg)   SpO2 98%   BMI 32.48 kg/m  General: Well developed, NAD, BMI noted Neck: No  thyromegaly  HEENT:  Normocephalic . Face symmetric, atraumatic Lungs:  CTA B Normal respiratory effort, no intercostal retractions, no accessory muscle use. Heart: RRR,  no murmur.  Abdomen:  Not distended, soft, non-tender. No rebound or rigidity.   Lower extremities: no pretibial edema bilaterally  Skin: Exposed areas without rash. Not pale. Not jaundice Neurologic:  alert & oriented X3.  Speech normal, gait appropriate for age and unassisted Strength  symmetric and appropriate for age.  Psych: Cognition and judgment appear intact.  Cooperative with normal attention span and concentration.  Behavior appropriate. No anxious or depressed appearing.     Assessment    Assessment HTN (started med 10/2019) Prediabetes (a1c 6.0  08/2016) GERD PPD +1999, s/p INH. PPD (-) 2001  PLAN:  Here for CPX Hyperglycemia: Checking A1c HTN: Good compliance with Hyzaar, BP is great, normal BPs at home. Checking labs. Prediabetes: Reports he is doing okay with diet, just went back to the gym a week ago, recommend to do plenty of cardio. GERD: Well-controlled Increased TSH, history of: Check TFTs RTC 1 year. RTC 2 to 3 months for Shingrix No. 2  This visit occurred during the SARS-CoV-2 public health emergency.  Safety protocols were in place, including screening questions prior to the visit, additional usage of staff PPE, and extensive cleaning of exam room while observing appropriate contact time as indicated for disinfecting solutions.

## 2020-10-14 LAB — HEPATITIS C ANTIBODY
Hepatitis C Ab: NONREACTIVE
SIGNAL TO CUT-OFF: 0.02 (ref <1.00)

## 2020-10-15 ENCOUNTER — Encounter: Payer: Self-pay | Admitting: Internal Medicine

## 2020-10-15 NOTE — Assessment & Plan Note (Signed)
Here for CPX Hyperglycemia: Checking A1c HTN: Good compliance with Hyzaar, BP is great, normal BPs at home. Checking labs. Prediabetes: Reports he is doing okay with diet, just went back to the gym a week ago, recommend to do plenty of cardio. GERD: Well-controlled Increased TSH, history of: Check TFTs RTC 1 year. RTC 2 to 3 months for Shingrix No. 2

## 2020-10-15 NOTE — Assessment & Plan Note (Signed)
-  Tdap2019 - Covid vax x 3, booster  - shingrix :pt elected to  proceed with first vaccine now, next in 2 to 3 months -KDX:[id number] 10/2017, next per GI -prostate ca: DRE- PSA done  10/2019 -Diet-exercise: Discussed. Labs: CMP, FLP, CBC, A1c, TSH, free T4, hep C

## 2020-10-21 ENCOUNTER — Other Ambulatory Visit: Payer: Self-pay

## 2020-10-21 DIAGNOSIS — R739 Hyperglycemia, unspecified: Secondary | ICD-10-CM

## 2020-10-21 MED ORDER — ATORVASTATIN CALCIUM 20 MG PO TABS: 20.0000 mg | ORAL_TABLET | Freq: Every evening | ORAL | 3 refills | Status: DC

## 2020-12-06 ENCOUNTER — Ambulatory Visit (INDEPENDENT_AMBULATORY_CARE_PROVIDER_SITE_OTHER): Admitting: Emergency Medicine

## 2020-12-06 VITALS — BP 159/94 | HR 90 | Temp 97.5°F | Resp 16

## 2020-12-06 MED ORDER — BACLOFEN 20 MG OR TABS
20.0000 mg | ORAL_TABLET | Freq: Three times a day (TID) | ORAL | 2 refills | Status: DC
Start: 2020-12-06 — End: 2020-12-08

## 2020-12-06 MED ORDER — KETOCONAZOLE 2 % EX CREA
1.0000 | TOPICAL_CREAM | Freq: Every day | CUTANEOUS | 2 refills | Status: DC
Start: 2020-12-06 — End: 2020-12-08

## 2020-12-06 NOTE — Progress Notes (Signed)
 Subjective:   Matthew Estes is a 55 year old male who is here for Physical      HPI   55 years old male , wheel chair confined after spinal cord injury after infection , caused paraplegia     Issues discussed today:  Establish PMD      Review of Systems   Constitutional: Negative.    HENT: Negative.    Eyes: Negative.    Respiratory: Negative.    Cardiovascular: Negative.    Endocrine: Negative.    Genitourinary:        Foley in place   Musculoskeletal:        Paraplegia   Hematological: Negative.    Psychiatric/Behavioral: Negative.         No current outpatient medications on file.     No current facility-administered medications for this visit.     No Known Allergies    Reviewed patients pertinent information related to social history, past medical, past surgical, and family history.     Objective:  Vital signs: BP (!) 159/94 (BP Location: Left arm, BP Patient Position: Sitting)   Pulse 90   Temp 97.5 F (36.4 C) (Temporal)   Resp 16   SpO2 96%     Physical Exam   Wheelchair confined in no apparent distress   Will complete Examination next visit     Assessment/Plan:  Matthew Estes was seen today for physical.    Diagnoses and all orders for this visit:    Essential hypertension  -     CBC w/ Diff [place]  -     Comprehensive Metabolic Panel; Future  -     Glycosylated Hgb(A1C), Blood [place]; Future  -     Lipid Panel Green Plasma Separator Tube; Future  -     TSH, Blood - See Instructions  -     Comprehensive Metabolic Panel  -     Glycosylated Hgb(A1C), Blood [place]  -     Lipid Panel Green Plasma Separator Tube    History of spinal cord injury    Overflow incontinence of urine    Paraplegia (CMS-HCC)    Foley catheter in place    Seborrhea    Pressure injury of skin of buttock, unspecified injury stage, unspecified laterality    Difficulty transferring from chair to wheelchair    Diabetic polyneuropathy associated with type 2 diabetes mellitus (CMS-HCC)      New Wheel chair   Lifting sling  Power  wheelchair to be fixed   Bed controller for the Hospital bed  Home health for the catheter and the buttocks wounds

## 2020-12-07 LAB — LIPID(CHOL FRACT) PANEL, BLOOD
Cholesterol: 329 mg/dL — ABNORMAL HIGH (ref 100–199)
HDL Cholesterol: 45 mg/dL (ref >39)
LDL Chol CAL (NIH) - LABCORP: 214 mg/dL — ABNORMAL HIGH (ref 0–99)
Non-HDL Cholesterol: 284 mg/dL — ABNORMAL HIGH (ref 0–129)
Triglycerides: 340 mg/dL — ABNORMAL HIGH (ref 0–149)
VLDL Cholesterol CAL: 70 mg/dL — ABNORMAL HIGH (ref 5–40)

## 2020-12-07 LAB — CBC WITH DIFF, BLOOD
Baso (Absolute): 0.1 10*3/uL (ref 0.0–0.2)
Basos: 1 %
Eos (Absolute): 0.5 10*3/uL — ABNORMAL HIGH (ref 0.0–0.4)
Eos: 5 %
Hematocrit: 45.3 % (ref 37.5–51.0)
Hemoglobin: 14.6 g/dL (ref 13.0–17.7)
Immature Grans (Abs): 0.1 10*3/uL (ref 0.0–0.1)
Immature Granulocytes: 1 %
Lymphs (Absolute): 2 10*3/uL (ref 0.7–3.1)
Lymphs: 24 %
MCH: 26.9 pg (ref 26.6–33.0)
MCHC: 32.2 g/dL (ref 31.5–35.7)
MCV: 83 fL (ref 79–97)
Monocytes(Absolute): 0.4 10*3/uL (ref 0.1–0.9)
Monocytes: 5 %
Neutrophils (Absolute): 5.3 10*3/uL (ref 1.4–7.0)
Neutrophils: 64 %
Platelets: 437 10*3/uL (ref 150–450)
RBC: 5.43 x10E6/uL (ref 4.14–5.80)
RDW: 14.2 % (ref 11.6–15.4)
WBC: 8.3 10*3/uL (ref 3.4–10.8)

## 2020-12-07 LAB — COMPREHENSIVE METABOLIC PANEL, BLOOD
A/G Ratio: 1.1 — ABNORMAL LOW (ref 1.2–2.2)
ALT (SGPT): 11 IU/L (ref 0–44)
AST: 15 IU/L (ref 0–40)
Albumin: 3.4 g/dL — ABNORMAL LOW (ref 3.8–4.9)
Alkaline Phos: 133 IU/L — ABNORMAL HIGH (ref 44–121)
BUN/Creatinine Ratio: 32 — ABNORMAL HIGH (ref 9–20)
BUN: 21 mg/dL (ref 6–24)
Bilirubin, Total: <0.2 mg/dL (ref 0.0–1.2)
Calcium: 8.8 mg/dL (ref 8.7–10.2)
Carbon Dioxide: 19 mmol/L — ABNORMAL LOW (ref 20–29)
Chloride: 97 mmol/L (ref 96–106)
Creatinine: 0.66 mg/dL — ABNORMAL LOW (ref 0.76–1.27)
EGFR: 112 mL/min/{1.73_m2} (ref >59)
Globulin, Total: 3 g/dL (ref 1.5–4.5)
Glucose: 430 mg/dL — ABNORMAL HIGH (ref 65–99)
Potassium: 4.9 mmol/L (ref 3.5–5.2)
Protein, Total, Serum: 6.4 g/dL (ref 6.0–8.5)
Sodium: 134 mmol/L (ref 134–144)

## 2020-12-07 LAB — GLYCOSYLATED HGB(A1C), BLOOD: Hemoglobin A1c: 10.9 % — ABNORMAL HIGH (ref 4.8–5.6)

## 2020-12-07 LAB — TSH, BLOOD: TSH: 2.16 u[IU]/mL (ref 0.450–4.500)

## 2020-12-07 LAB — CARDIOVASCULAR RISK ASSESSMENT REPORT - LABCORP

## 2020-12-08 MED ORDER — KETOCONAZOLE 2 % EX CREA
1.0000 | TOPICAL_CREAM | Freq: Every day | CUTANEOUS | 2 refills | Status: DC
Start: 2020-12-08 — End: 2021-09-13

## 2020-12-08 MED ORDER — BACLOFEN 20 MG OR TABS
20.0000 mg | ORAL_TABLET | Freq: Three times a day (TID) | ORAL | 2 refills | Status: AC
Start: 2020-12-08 — End: 2021-01-07

## 2020-12-13 ENCOUNTER — Ambulatory Visit (INDEPENDENT_AMBULATORY_CARE_PROVIDER_SITE_OTHER): Admitting: Emergency Medicine

## 2020-12-13 VITALS — BP 136/93 | HR 95 | Temp 97.7°F

## 2020-12-13 MED ORDER — INSULIN PEN NEEDLE 32G X 4 MM MISC
3 refills | Status: DC
Start: 2020-12-13 — End: 2021-09-13

## 2020-12-13 MED ORDER — INSULIN GLARGINE 100 UNIT/ML SC SOPN
20.0000 [IU] | PEN_INJECTOR | Freq: Every evening | SUBCUTANEOUS | 5 refills | Status: DC
Start: 2020-12-13 — End: 2021-09-13

## 2020-12-13 MED ORDER — KETOCONAZOLE 2 % EX CREA
1.0000 | TOPICAL_CREAM | Freq: Every day | CUTANEOUS | 5 refills | Status: DC
Start: 2020-12-13 — End: 2021-09-13

## 2020-12-13 MED ORDER — LOSARTAN POTASSIUM 50 MG OR TABS
50.0000 mg | ORAL_TABLET | Freq: Every day | ORAL | 5 refills | Status: DC
Start: 2020-12-13 — End: 2021-09-13

## 2020-12-13 MED ORDER — ACETAMINOPHEN 500 MG OR TABS
500.0000 mg | ORAL_TABLET | Freq: Four times a day (QID) | ORAL | 5 refills | Status: AC | PRN
Start: 2020-12-13 — End: 2021-01-12

## 2020-12-13 MED ORDER — BACLOFEN 20 MG OR TABS
20.0000 mg | ORAL_TABLET | Freq: Three times a day (TID) | ORAL | 5 refills | Status: AC
Start: 2020-12-13 — End: 2021-01-12

## 2020-12-13 MED ORDER — ATORVASTATIN CALCIUM 40 MG OR TABS
40.0000 mg | ORAL_TABLET | Freq: Every day | ORAL | 5 refills | Status: AC
Start: 2020-12-13 — End: 2021-01-12

## 2020-12-14 NOTE — Progress Notes (Signed)
 This encounter was opened in error.  Please disregard.

## 2020-12-14 NOTE — Addendum Note (Signed)
 Addended by: Benay Pike on: 12/14/2020 04:54 PM     Modules accepted: Level of Service, SmartSet

## 2020-12-25 NOTE — Progress Notes (Signed)
 Subjective:   Matthew Estes is a 55 year old male who is here for No chief complaint on file.      HPI    Issues discussed today:  Wrong chart    Review of Systems     Current Outpatient Medications   Medication Sig Dispense Refill   . acetaminophen (TYLENOL) 500 MG tablet Take 1 tablet (500 mg) by mouth every 6 hours as needed for Mild Pain (Pain Score 1-3) for up to 30 days. 120 tablet 5   . atorvastatin (LIPITOR) 40 MG tablet Take 1 tablet (40 mg) by mouth daily for 30 days. 30 tablet 5   . baclofen (LIORESAL) 20 MG tablet Take 1 tablet (20 mg) by mouth 3 times daily for 30 days. 90 tablet 5   . baclofen (LIORESAL) 20 MG tablet Take 1 tablet (20 mg) by mouth 3 times daily for 30 days. 90 tablet 2   . insulin glargine 100 units/mL injection pen Inject 20 Units under the skin nightly for 30 days. 15 mL 5   . Insulin Pen Needles 32G X 4 MM MISC Use one pen needle with each insulin administration. 300 each 3   . ketoconazole (NIZORAL) 2 % cream Apply 1 Application topically daily. Use a small amount as directed 60 g 5   . ketoconazole (NIZORAL) 2 % cream Apply 1 Application topically daily. Use a small amount as directed 60 g 2   . losartan (COZAAR) 50 MG tablet Take 1 tablet (50 mg) by mouth daily for 30 days. 30 tablet 5     No current facility-administered medications for this visit.     No Known Allergies    Reviewed patients pertinent information related to social history, past medical, past surgical, and family history.     Objective:  Vital signs: BP (!) 136/93 (BP Location: Right arm, BP Patient Position: Sitting)   Pulse 95   Temp 97.7 F (36.5 C)     Physical Exam    Assessment/Plan:  Diagnoses and all orders for this visit:    Essential hypertension  -     losartan (COZAAR) 50 MG tablet; Take 1 tablet (50 mg) by mouth daily for 30 days.    Diabetic polyneuropathy associated with type 2 diabetes mellitus (CMS-HCC)  -     insulin glargine 100 units/mL injection pen; Inject 20 Units under the skin  nightly for 30 days.  -     Insulin Pen Needles 32G X 4 MM MISC; Use one pen needle with each insulin administration.  -     atorvastatin (LIPITOR) 40 MG tablet; Take 1 tablet (40 mg) by mouth daily for 30 days.    Foley catheter in place    Difficulty transferring from chair to wheelchair    Paraplegia (CMS-HCC)  -     baclofen (LIORESAL) 20 MG tablet; Take 1 tablet (20 mg) by mouth 3 times daily for 30 days.    History of spinal cord injury    Overflow incontinence of urine    Seborrhea  -     ketoconazole (NIZORAL) 2 % cream; Apply 1 Application topically daily. Use a small amount as directed    Intractable episodic cluster headache  -     acetaminophen (TYLENOL) 500 MG tablet; Take 1 tablet (500 mg) by mouth every 6 hours as needed for Mild Pain (Pain Score 1-3) for up to 30 days.

## 2021-01-11 ENCOUNTER — Ambulatory Visit (INDEPENDENT_AMBULATORY_CARE_PROVIDER_SITE_OTHER): Payer: 59 | Admitting: *Deleted

## 2021-01-11 ENCOUNTER — Other Ambulatory Visit (INDEPENDENT_AMBULATORY_CARE_PROVIDER_SITE_OTHER): Payer: 59

## 2021-01-11 ENCOUNTER — Other Ambulatory Visit: Payer: Self-pay

## 2021-01-11 DIAGNOSIS — R739 Hyperglycemia, unspecified: Secondary | ICD-10-CM

## 2021-01-11 DIAGNOSIS — Z23 Encounter for immunization: Secondary | ICD-10-CM | POA: Diagnosis not present

## 2021-01-11 LAB — LIPID PANEL
Cholesterol: 121 mg/dL (ref 0–200)
HDL: 37.4 mg/dL — ABNORMAL LOW (ref >39.00)
LDL Cholesterol: 54 mg/dL (ref 0–99)
NonHDL: 83.84
Total CHOL/HDL Ratio: 3
Triglycerides: 150 mg/dL — ABNORMAL HIGH (ref 0.0–149.0)
VLDL: 30 mg/dL (ref 0.0–40.0)

## 2021-01-11 LAB — AST: AST: 19 U/L (ref 0–37)

## 2021-01-11 LAB — ALT: ALT: 26 U/L (ref 0–53)

## 2021-01-11 NOTE — Progress Notes (Signed)
Bryan Shaw is a 55 y.o. male presents to the office today for Shingrix Vaccine, per physician's orders. Shingrix vaccine was administered in right deltoid today. Patient tolerated injection.  Gerilyn Nestle

## 2021-01-13 MED ORDER — ATORVASTATIN CALCIUM 20 MG PO TABS: 20.0000 mg | ORAL_TABLET | Freq: Every evening | ORAL | 1 refills | Status: DC

## 2021-01-13 NOTE — Addendum Note (Signed)
Addended byDamita Dunnings D on: 01/13/2021 07:40 AM   Modules accepted: Orders

## 2021-02-08 ENCOUNTER — Ambulatory Visit (INDEPENDENT_AMBULATORY_CARE_PROVIDER_SITE_OTHER): Admitting: Emergency Medicine

## 2021-02-08 VITALS — BP 149/79 | HR 90 | Temp 98.1°F | Resp 16 | Ht 68.0 in | Wt 182.4 lb

## 2021-02-09 LAB — GLYCOSYLATED HGB(A1C), BLOOD: Hgb A1C: 6.4 % of total Hgb — ABNORMAL HIGH (ref <5.7)

## 2021-03-03 ENCOUNTER — Other Ambulatory Visit (INDEPENDENT_AMBULATORY_CARE_PROVIDER_SITE_OTHER): Payer: Self-pay | Admitting: Emergency Medicine

## 2021-03-03 DIAGNOSIS — E782 Mixed hyperlipidemia: Secondary | ICD-10-CM

## 2021-03-03 MED ORDER — ATORVASTATIN CALCIUM 10 MG OR TABS
ORAL_TABLET | ORAL | 1 refills | Status: DC
Start: 2021-03-03 — End: 2021-09-13

## 2021-04-03 ENCOUNTER — Other Ambulatory Visit (INDEPENDENT_AMBULATORY_CARE_PROVIDER_SITE_OTHER): Payer: Self-pay | Admitting: Emergency Medicine

## 2021-04-03 DIAGNOSIS — I10 Essential (primary) hypertension: Secondary | ICD-10-CM

## 2021-04-03 DIAGNOSIS — E119 Type 2 diabetes mellitus without complications: Secondary | ICD-10-CM

## 2021-04-03 MED ORDER — METFORMIN HCL 1000 MG OR TABS
ORAL_TABLET | ORAL | 5 refills | Status: DC
Start: 2021-04-03 — End: 2021-09-13

## 2021-04-03 MED ORDER — PIOGLITAZONE HCL 30 MG OR TABS
ORAL_TABLET | ORAL | 5 refills | Status: DC
Start: 2021-04-03 — End: 2021-09-13

## 2021-04-03 MED ORDER — JANUVIA 100 MG OR TABS
ORAL_TABLET | ORAL | 5 refills | Status: DC
Start: 2021-04-03 — End: 2021-09-13

## 2021-04-03 MED ORDER — LOSARTAN POTASSIUM 25 MG OR TABS
ORAL_TABLET | ORAL | 5 refills | Status: DC
Start: 2021-04-03 — End: 2023-02-14

## 2021-04-26 ENCOUNTER — Encounter: Payer: Self-pay | Admitting: Internal Medicine

## 2021-04-26 ENCOUNTER — Ambulatory Visit (INDEPENDENT_AMBULATORY_CARE_PROVIDER_SITE_OTHER): Payer: 59 | Admitting: Internal Medicine

## 2021-04-26 ENCOUNTER — Other Ambulatory Visit: Payer: Self-pay

## 2021-04-26 VITALS — BP 126/90 | HR 51 | Temp 98.2°F | Resp 16 | Ht 70.0 in | Wt 220.2 lb

## 2021-04-26 DIAGNOSIS — Z23 Encounter for immunization: Secondary | ICD-10-CM | POA: Diagnosis not present

## 2021-04-26 DIAGNOSIS — I10 Essential (primary) hypertension: Secondary | ICD-10-CM

## 2021-04-26 DIAGNOSIS — E119 Type 2 diabetes mellitus without complications: Secondary | ICD-10-CM

## 2021-04-26 LAB — HEMOGLOBIN A1C: Hgb A1c MFr Bld: 6.5 % (ref 4.6–6.5)

## 2021-04-26 LAB — BASIC METABOLIC PANEL
BUN: 11 mg/dL (ref 6–23)
CO2: 33 mEq/L — ABNORMAL HIGH (ref 19–32)
Calcium: 9.7 mg/dL (ref 8.4–10.5)
Chloride: 101 mEq/L (ref 96–112)
Creatinine, Ser: 1.01 mg/dL (ref 0.40–1.50)
GFR: 84.78 mL/min (ref >60.00)
Glucose, Bld: 112 mg/dL — ABNORMAL HIGH (ref 70–99)
Potassium: 4.5 mEq/L (ref 3.5–5.1)
Sodium: 140 mEq/L (ref 135–145)

## 2021-04-26 NOTE — Progress Notes (Signed)
Subjective:    Patient ID: Bryan Shaw, male    DOB: 1965-10-07, 56 y.o.   MRN: [id number]  DOS:  04/26/2021 Type of visit - description: rov  Since the last office visit he was diagnosed with diabetes and started on cholesterol medication. Reports he is doing well. Has lost few pounds, he has not actually changed his diet. He denies any nausea, vomiting, diarrhea. No fever or chills. Feels well.  Wt Readings from Last 3 Encounters:  04/26/21 220 lb 4 oz (99.9 kg)  10/13/20 226 lb 6 oz (102.7 kg)  05/04/20 222 lb (100.7 kg)   BP Readings from Last 3 Encounters:  04/26/21 126/90  10/13/20 118/80  05/04/20 129/80     Review of Systems See above   Past Medical History:  Diagnosis Date   Diabetes mellitus, type 2 ([place])    Essential hypertension 12/31/2019   GERD (gastroesophageal reflux disease)    Positive PPD 1999   INH x 6 months   PPD negative 2001    Past Surgical History:  Procedure Laterality Date   NO PAST SURGERIES      Allergies as of 04/26/2021   No Known Allergies      Medication List        Accurate as of April 26, 2021 11:59 PM. If you have any questions, ask your nurse or doctor.          atorvastatin 20 MG tablet Commonly known as: LIPITOR Take 1 tablet (20 mg total) by mouth at bedtime.   losartan-hydrochlorothiazide 50-12.5 MG tablet Commonly known as: HYZAAR Take 1 tablet by mouth daily.   OMEGA 3-6-9 COMPLEX PO Take by mouth.   omeprazole 20 MG capsule Commonly known as: PRILOSEC Take 1 capsule (20 mg total) by mouth daily.           Objective:   Physical Exam BP 126/90 (BP Location: Left Arm, Patient Position: Sitting, Cuff Size: Normal)   Pulse (!) 51   Temp 98.2 F (36.8 C) (Oral)   Resp 16   Ht 5\' 10"  (1.778 m)   Wt 220 lb 4 oz (99.9 kg)   SpO2 95%   BMI 31.60 kg/m  General:   Well developed, NAD, BMI noted. HEENT:  Normocephalic . Face symmetric, atraumatic Lungs:  CTA B Normal respiratory  effort, no intercostal retractions, no accessory muscle use. Heart: RRR,  no murmur.  Lower extremities: no pretibial edema bilaterally  Skin: Not pale. Not jaundice Neurologic:  alert & oriented X3.  Speech normal, gait appropriate for age and unassisted Psych--  Cognition and judgment appear intact.  Cooperative with normal attention span and concentration.  Behavior appropriate. No anxious or depressed appearing.      Assessment     Assessment DM Dx/2022 HTN (started med 10/2019) GERD PPD +1999, s/p INH. PPD (-) 2001  PLAN:  DM: Previously had hyperglycemia, A1c in April was 6.9 thus  Dx is diabetes, this was explained to the patient, I discussed the need to eat healthy, lower carbohydrates, stay active.  Check A1c. Depending on results, he might need medications.  He is aware that SGLT2 inhibitors would be ideal but cost could be high. HTN: Currently on Hyzaar, diastolic BP today 90, recommend to check ambulatory BPs.  Check BMP Hyperlipidemia: LDL on April 2022 was 107, atorvastatin started, follow-up LDL 54 Preventive care: Flu shot today, rec COVID booster. RTC 6 months CPX   This visit occurred during the SARS-CoV-2 public health emergency.  Safety  protocols were in place, including screening questions prior to the visit, additional usage of staff PPE, and extensive cleaning of exam room while observing appropriate contact time as indicated for disinfecting solutions.

## 2021-04-26 NOTE — Patient Instructions (Addendum)
Recommend to proceed with new covid vaccine (bivalent) at your pharmacy or downstairs at our pharmacy.   Check the  blood pressure 2 times a month BP GOAL is between 110/65 and  135/85. If it is consistently higher or lower, let me know  GO TO THE LAB : Get the blood work     [place], [place] back for   a physical exam by  09/2021

## 2021-04-27 NOTE — Assessment & Plan Note (Signed)
DM: Previously had hyperglycemia, A1c in April was 6.9 thus  Dx is diabetes, this was explained to the patient, I discussed the need to eat healthy, lower carbohydrates, stay active.  Check A1c. Depending on results, he might need medications.  He is aware that SGLT2 inhibitors would be ideal but cost could be high. HTN: Currently on Hyzaar, diastolic BP today 90, recommend to check ambulatory BPs.  Check BMP Hyperlipidemia: LDL on April 2022 was 107, atorvastatin started, follow-up LDL 54 Preventive care: Flu shot today, rec COVID booster. RTC 6 months CPX

## 2021-07-24 ENCOUNTER — Other Ambulatory Visit: Payer: Self-pay | Admitting: Internal Medicine

## 2021-08-10 ENCOUNTER — Encounter: Payer: Self-pay | Admitting: Internal Medicine

## 2021-08-22 ENCOUNTER — Ambulatory Visit (INDEPENDENT_AMBULATORY_CARE_PROVIDER_SITE_OTHER): Admitting: Emergency Medicine

## 2021-09-07 ENCOUNTER — Ambulatory Visit (INDEPENDENT_AMBULATORY_CARE_PROVIDER_SITE_OTHER): Admitting: Emergency Medicine

## 2021-09-13 ENCOUNTER — Ambulatory Visit (INDEPENDENT_AMBULATORY_CARE_PROVIDER_SITE_OTHER): Admitting: Emergency Medicine

## 2021-09-13 VITALS — BP 167/85 | HR 85 | Temp 98.0°F | Resp 16 | Ht 68.0 in | Wt 185.6 lb

## 2021-09-13 MED ORDER — ATORVASTATIN CALCIUM 10 MG OR TABS
10.0000 mg | ORAL_TABLET | Freq: Every day | ORAL | 3 refills | Status: DC
Start: 2021-09-13 — End: 2022-10-05

## 2021-09-13 MED ORDER — METFORMIN HCL 1000 MG OR TABS
1000.0000 mg | ORAL_TABLET | Freq: Two times a day (BID) | ORAL | 3 refills | Status: DC
Start: 2021-09-13 — End: 2022-08-31

## 2021-09-13 MED ORDER — TRUE METRIX BLOOD GLUCOSE TEST VI STRP
ORAL_STRIP | Freq: Three times a day (TID) | Status: DC
Start: 2021-08-25 — End: 2022-01-29

## 2021-09-13 MED ORDER — LOSARTAN POTASSIUM 50 MG OR TABS
50.0000 mg | ORAL_TABLET | Freq: Every day | ORAL | 3 refills | Status: DC
Start: 2021-09-13 — End: 2022-08-31

## 2021-09-13 MED ORDER — PIOGLITAZONE HCL 30 MG OR TABS
30.0000 mg | ORAL_TABLET | Freq: Every day | ORAL | 3 refills | Status: DC
Start: 2021-09-13 — End: 2022-10-05

## 2021-09-13 MED ORDER — JANUVIA 100 MG OR TABS
100.0000 mg | ORAL_TABLET | Freq: Every day | ORAL | 3 refills | Status: DC
Start: 2021-09-13 — End: 2022-10-05

## 2021-09-14 LAB — COMPREHENSIVE METABOLIC PANEL, BLOOD
ALT (SGPT): 24 U/L (ref 9–46)
AST (SGOT): 23 U/L (ref 10–35)
Albumin/Glob Ratio: 1.8 (calc) (ref 1.0–2.5)
Albumin: 4.6 g/dL (ref 3.6–5.1)
Alkaline Phos: 85 U/L (ref 35–144)
BUN: 19 mg/dL (ref 7–25)
Bilirubin, Total: 0.8 mg/dL (ref 0.2–1.2)
Calcium: 9.6 mg/dL (ref 8.6–10.3)
Carbon Dioxide: 22 mmol/L (ref 20–32)
Chloride: 102 mmol/L (ref 98–110)
Creatinine: 0.94 mg/dL (ref 0.70–1.30)
EGFR: 96 mL/min/{1.73_m2} (ref >=60)
Globulin: 2.5 g/dL (calc) (ref 1.9–3.7)
Glucose: 134 mg/dL — ABNORMAL HIGH (ref 65–99)
Potassium: 4.6 mmol/L (ref 3.5–5.3)
Sodium: 139 mmol/L (ref 135–146)
Total Protein: 7.1 g/dL (ref 6.1–8.1)

## 2021-09-14 LAB — CBC WITH DIFF, BLOOD
Abs Basophils: 38 cells/uL (ref 0–200)
Abs Eosinophils: 81 cells/uL (ref 15–500)
Abs Lymphs: 1663 cells/uL (ref 850–3900)
Abs Monocytes: 410 cells/uL (ref 200–950)
Abs Neutrophils: 3208 cells/uL (ref 1500–7800)
Basophils: 0.7 %
Eosinophils: 1.5 %
HCT: 41.3 % (ref 38.5–50.0)
HGB: 14.2 g/dL (ref 13.2–17.1)
Lymps: 30.8 %
MCH: 29.9 pg (ref 27.0–33.0)
MCHC: 34.4 g/dL (ref 32.0–36.0)
MCV: 86.9 fL (ref 80.0–100.0)
MPV: 11.4 fL (ref 7.5–12.5)
Monocytes: 7.6 %
PLT: 224 10*3/uL (ref 140–400)
RBC: 4.75 10*6/uL (ref 4.20–5.80)
RDW: 13 % (ref 11.0–15.0)
SEGS: 59.4 %
WBC: 5.4 10*3/uL (ref 3.8–10.8)

## 2021-09-14 LAB — LDL CHOLESTEROL, DIRECT: LDL-Cholesterol: 99 mg/dL (calc)

## 2021-09-14 LAB — NON HDL CHOLESTEROL -QUEST: Non-HDL Cholesterol: 116 mg/dL (calc) (ref <130)

## 2021-09-14 LAB — HDL-CHOLESTEROL, BLOOD: HDL Cholesterol: 66 mg/dL (ref >=40)

## 2021-09-14 LAB — CHOLESTEROL/HDLC RATIO-QUEST: Chol/HDLC Ratio: 2.8 (calc) (ref <5.0)

## 2021-09-14 LAB — TRIGLYCERIDES, BLOOD: Triglycerides: 83 mg/dL (ref <150)

## 2021-09-14 LAB — TSH W/REFLEX TO FT4-QUEST: TSH: 1.02 mIU/L (ref 0.40–4.50)

## 2021-09-14 LAB — SPECIMEN INTEGRITY COMPROMISED

## 2021-09-14 LAB — CHOLESTEROL, TOTAL BLOOD: Cholesterol: 182 mg/dL (ref <200)

## 2021-09-14 LAB — GLYCOSYLATED HGB(A1C), BLOOD: Hgb A1C: 6.8 % of total Hgb — ABNORMAL HIGH (ref <5.7)

## 2021-09-28 ENCOUNTER — Ambulatory Visit (INDEPENDENT_AMBULATORY_CARE_PROVIDER_SITE_OTHER): Admitting: Emergency Medicine

## 2021-09-28 VITALS — BP 148/73 | HR 96 | Temp 97.6°F | Resp 16 | Ht 68.0 in | Wt 185.0 lb

## 2021-10-04 LAB — HM DIABETES EYE EXAM

## 2021-10-18 ENCOUNTER — Encounter: Payer: 59 | Admitting: Internal Medicine

## 2021-10-19 ENCOUNTER — Encounter: Payer: Self-pay | Admitting: Internal Medicine

## 2021-10-19 ENCOUNTER — Ambulatory Visit (INDEPENDENT_AMBULATORY_CARE_PROVIDER_SITE_OTHER): Payer: 59 | Admitting: Internal Medicine

## 2021-10-19 VITALS — BP 126/74 | HR 63 | Temp 98.3°F | Resp 16 | Ht 70.0 in | Wt 222.2 lb

## 2021-10-19 DIAGNOSIS — Z0000 Encounter for general adult medical examination without abnormal findings: Secondary | ICD-10-CM | POA: Diagnosis not present

## 2021-10-19 DIAGNOSIS — E119 Type 2 diabetes mellitus without complications: Secondary | ICD-10-CM | POA: Diagnosis not present

## 2021-10-19 DIAGNOSIS — I10 Essential (primary) hypertension: Secondary | ICD-10-CM

## 2021-10-19 LAB — COMPREHENSIVE METABOLIC PANEL
ALT: 31 U/L (ref 0–53)
AST: 20 U/L (ref 0–37)
Albumin: 4.4 g/dL (ref 3.5–5.2)
Alkaline Phosphatase: 76 U/L (ref 39–117)
BUN: 14 mg/dL (ref 6–23)
CO2: 34 mEq/L — ABNORMAL HIGH (ref 19–32)
Calcium: 9.6 mg/dL (ref 8.4–10.5)
Chloride: 100 mEq/L (ref 96–112)
Creatinine, Ser: 1.06 mg/dL (ref 0.40–1.50)
GFR: 79.74 mL/min (ref >60.00)
Glucose, Bld: 127 mg/dL — ABNORMAL HIGH (ref 70–99)
Potassium: 4.3 mEq/L (ref 3.5–5.1)
Sodium: 140 mEq/L (ref 135–145)
Total Bilirubin: 0.5 mg/dL (ref 0.2–1.2)
Total Protein: 7.4 g/dL (ref 6.0–8.3)

## 2021-10-19 LAB — CBC WITH DIFFERENTIAL/PLATELET
Basophils Absolute: 0.1 10*3/uL (ref 0.0–0.1)
Basophils Relative: 0.5 % (ref 0.0–3.0)
Eosinophils Absolute: 0.2 10*3/uL (ref 0.0–0.7)
Eosinophils Relative: 2.3 % (ref 0.0–5.0)
HCT: 46.9 % (ref 39.0–52.0)
Hemoglobin: 15.4 g/dL (ref 13.0–17.0)
Lymphocytes Relative: 29.6 % (ref 12.0–46.0)
Lymphs Abs: 3.1 10*3/uL (ref 0.7–4.0)
MCHC: 32.8 g/dL (ref 30.0–36.0)
MCV: 84.1 fl (ref 78.0–100.0)
Monocytes Absolute: 0.8 10*3/uL (ref 0.1–1.0)
Monocytes Relative: 7.6 % (ref 3.0–12.0)
Neutro Abs: 6.2 10*3/uL (ref 1.4–7.7)
Neutrophils Relative %: 60 % (ref 43.0–77.0)
Platelets: 206 10*3/uL (ref 150.0–400.0)
RBC: 5.57 Mil/uL (ref 4.22–5.81)
RDW: 14.4 % (ref 11.5–15.5)
WBC: 10.3 10*3/uL (ref 4.0–10.5)

## 2021-10-19 LAB — LIPID PANEL
Cholesterol: 119 mg/dL (ref 0–200)
HDL: 36.6 mg/dL — ABNORMAL LOW (ref >39.00)
LDL Cholesterol: 58 mg/dL (ref 0–99)
NonHDL: 82.17
Total CHOL/HDL Ratio: 3
Triglycerides: 123 mg/dL (ref 0.0–149.0)
VLDL: 24.6 mg/dL (ref 0.0–40.0)

## 2021-10-19 LAB — HEMOGLOBIN A1C: Hgb A1c MFr Bld: 7 % — ABNORMAL HIGH (ref 4.6–6.5)

## 2021-10-19 LAB — PSA: PSA: 0.36 ng/mL (ref 0.10–4.00)

## 2021-10-19 NOTE — Progress Notes (Addendum)
? ?Subjective:  ? ? Patient ID: Bryan Shaw, male    DOB: Nov 25, 1965, 56 y.o.   MRN: [id number] ? ?DOS:  10/19/2021 ?Type of visit - description: cpx ? ?In general feeling well. ?Has mild, chronic back pain for years.  Stable ?Has multiple lipomas. ? ?Review of Systems ? ?Other than above, a 14 point review of systems is negative  ? ?  ? ?Past Medical History:  ?Diagnosis Date  ? Diabetes mellitus, type 2 ([place])   ? Essential hypertension 12/31/2019  ? GERD (gastroesophageal reflux disease)   ? Positive PPD 1999  ? INH x 6 months  ? PPD negative 2001  ? ? ?Past Surgical History:  ?Procedure Laterality Date  ? NO PAST SURGERIES    ? ?Social History  ? ?Socioeconomic History  ? Marital status: Married  ?  Spouse name: Not on file  ? Number of children: 1  ? Years of education: Not on file  ? Highest education level: Not on file  ?Occupational History  ? Occupation: Economist  [employer]  ?Tobacco Use  ? Smoking status: Never  ? Smokeless tobacco: Never  ?Substance and Sexual Activity  ? Alcohol use: No  ? Drug use: No  ? Sexual activity: Yes  ?  Birth control/protection: None  ?Other Topics Concern  ? Not on file  ?Social History Narrative  ? From [place], wife from [place]   ? 1 adopted son, adult  ? Household: pt and wife   ? ?Social Determinants of Health  ? ?Financial Resource Strain: Not on file  ?Food Insecurity: Not on file  ?Transportation Needs: Not on file  ?Physical Activity: Not on file  ?Stress: Not on file  ?Social Connections: Not on file  ?Intimate Partner Violence: Not on file  ? ? ? ?Current Outpatient Medications  ?Medication Instructions  ? aspirin EC 81 mg, Oral, Daily, Swallow whole.  ? atorvastatin (LIPITOR) 20 mg, Oral, Nightly  ? losartan-hydrochlorothiazide (HYZAAR) 50-12.5 MG tablet 1 tablet, Oral, Daily  ? Omega 3-6-9 Fatty Acids (OMEGA 3-6-9 COMPLEX PO) Oral  ? omeprazole (PRILOSEC) 20 mg, Oral, Daily  ? ? ?   ?Objective:  ? Physical Exam ?BP 126/74 (BP Location: Left Arm, Patient  Position: Sitting, Cuff Size: Normal)   Pulse 63   Temp 98.3 ?F (36.8 ?C) (Oral)   Resp 16   Ht '5\' 10"'$  (1.778 m)   Wt 222 lb 4 oz (100.8 kg)   SpO2 97%   BMI 31.89 kg/m?  ?General: ?Well developed, NAD, BMI noted ?Neck: No  thyromegaly  ?HEENT:  ?Normocephalic . Face symmetric, atraumatic ?Lungs:  ?CTA B ?Normal respiratory effort, no intercostal retractions, no accessory muscle use. ?Heart: RRR,  no murmur.  ?Abdomen:  ?Not distended, soft, non-tender. No rebound or rigidity.   ?Lower extremities: no pretibial edema bilaterally  ?Skin: Exposed areas without rash. Not pale. Not jaundice ?DRE: Normal sphincter tone, normal prostate, no stools. ?Neurologic:  ?alert & oriented X3.  ?Speech normal, gait appropriate for age and unassisted ?Strength symmetric and appropriate for age.  ?Psych: ?Cognition and judgment appear intact.  ?Cooperative with normal attention span and concentration.  ?Behavior appropriate. ?No anxious or depressed appearing. ? ?   ?Assessment   ? ?Assessment ?DM Dx/2022 ?HTN (started med 10/2019) ?GERD ?Multiple lipomas  ?PPD +1999, s/p INH. PPD (-) 2001 ? ?PLAN:  ?Here for CPX ?DM: Diet controlled, check A1c. ?HTN: BP is excellent, on Hyzaar, checking labs ?High cholesterol: On Lipitor, check labs ?Multiple lipomas:  On exam, has a SQ lipoma at the inner right arm.  Not attached to deeper structures or the skin. ?Will need surgical referral if he ever develops pain or existing lipoma gets harder or has a sudden increase in size. ?Aspirin: Recommend to start a baby aspirin as primary prevention. ?RTC 6 months ? ?  ? ?This visit occurred during the SARS-CoV-2 public health emergency.  Safety protocols were in place, including screening questions prior to the visit, additional usage of staff PPE, and extensive cleaning of exam room while observing appropriate contact time as indicated for disinfecting solutions.  ? ?

## 2021-10-19 NOTE — Patient Instructions (Addendum)
?  Check the  blood pressure regularly ?BP GOAL is between 110/65 and  135/85. ?If it is consistently higher or lower, let me know ? ? ? ?GO TO THE LAB : Get the blood work   ? ? ?[place], [place] ?Come back for  a check up in 6 months  ? ? ? ? ? ?"Living will", "Health Care Power of attorney": Advanced care planning ? ?(If you already have a living will or healthcare power of attorney, please bring the copy to be scanned in your chart.) ? ?Advance care planning is a process that supports adults in  understanding and sharing their preferences regarding future medical care.  ? ?The patient's preferences are recorded in documents called Advance Directives.    ?Advanced directives are completed (and can be modified at any time) while the patient is in full mental capacity.  ? ?The documentation should be available at all times to the patient, the family and the healthcare providers.  ?Bring in a copy to be scanned in your chart is an excellent idea and is recommended  ? ?This legal documents direct treatment decision making and/or appoint a surrogate to make the decision if the patient is not capable to do so.  ? ? ?Advance directives can be documented in many types of formats,  documents have names such as:  ?Lliving will  ?Durable power of attorney for healthcare (healthcare proxy or healthcare power of attorney)  ?Combined directives  ?Physician orders for life-sustaining treatment  ?  ?More information at: ? [email or web address]  ?

## 2021-10-20 ENCOUNTER — Encounter: Payer: Self-pay | Admitting: Internal Medicine

## 2021-10-20 NOTE — Assessment & Plan Note (Signed)
Here for CPX ?DM: Diet controlled, check A1c. ?HTN: BP is excellent, on Hyzaar, checking labs ?High cholesterol: On Lipitor, check labs ?Multiple lipomas: On exam, has a SQ lipoma at the inner right arm.  Not attached to deeper structures or the skin. ?Will need surgical referral if he ever develops pain or existing lipoma gets harder or has a sudden increase in size. ?Aspirin: Recommend to start a baby aspirin as primary prevention. ?RTC 6 months ? ?

## 2021-10-20 NOTE — Assessment & Plan Note (Signed)
-  Tdap 2019 ?- Covid vax:  booster is an option ?- s/p shingrix x2 ?-CCS: Colonoscopy 10/2017, next 5 years per GI letter ?-prostate ca: No symptoms, DRE negative, checking PSA ?-Diet-exercise: active at work, does well w/ diet. ?- Labs: CMP, FLP, CBC, A1c, PSA ?- ACP info provided  ? ?  ?

## 2021-11-22 ENCOUNTER — Ambulatory Visit (INDEPENDENT_AMBULATORY_CARE_PROVIDER_SITE_OTHER): Admitting: Emergency Medicine

## 2022-01-29 ENCOUNTER — Ambulatory Visit (INDEPENDENT_AMBULATORY_CARE_PROVIDER_SITE_OTHER): Admitting: Emergency Medicine

## 2022-01-29 VITALS — BP 130/84 | HR 84 | Temp 98.0°F | Resp 16 | Ht 68.0 in | Wt 184.0 lb

## 2022-01-29 MED ORDER — TRUE METRIX BLOOD GLUCOSE TEST VI STRP
1.0000 | ORAL_STRIP | Freq: Three times a day (TID) | 3 refills | Status: DC
Start: 2022-01-29 — End: 2022-02-05

## 2022-01-29 NOTE — Progress Notes (Signed)
Subjective :  Matthew Estes is a 56 year old male who is here for Diabetes (Diabetes follow up)      HPI    Issues discussed today:  DM f/u    Review of Systems    Review of Systems   Constitutional: Negative.    HENT: Negative.    Eyes: Negative.    Respiratory: Negative.    Cardiovascular: Negative.    Gastrointestinal: Negative.    Endocrine: DMT2  Genitourinary: Negative.    Musculoskeletal: Negative.    Skin: Negative.    Allergic/Immunologic: Negative.    Neurological: Negative.    Hematological: Negative.    Psychiatric/Behavioral: Negative.        Current Outpatient Medications   Medication Sig Dispense Refill   . atorvastatin (LIPITOR) 10 MG tablet Take 1 tablet (10 mg) by mouth daily. 90 tablet 3   . atorvastatin (LIPITOR) 40 MG tablet Take 1 tablet (40 mg) by mouth daily for 30 days. 30 tablet 5   . losartan (COZAAR) 25 MG tablet TAKE 1 TABLET BY MOUTH EVERY DAY 30 tablet 5   . losartan (COZAAR) 50 MG tablet Take 1 tablet (50 mg) by mouth daily. 90 tablet 3   . metFORMIN (GLUCOPHAGE) 1000 mg tablet Take 1 tablet (1,000 mg) by mouth 2 times daily (with meals). 180 tablet 3   . pioglitazone (ACTOS) 30 mg tablet Take 1 tablet (30 mg) by mouth daily. 90 tablet 3   . sitaGLIPtin (JANUVIA) 100 mg tablet Take 1 tablet (100 mg) by mouth daily. 90 tablet 3   . TRUE METRIX BLOOD GLUCOSE TEST test strip 3 times daily.       No current facility-administered medications for this visit.     No Known Allergies    Reviewed patients pertinent information related to social history, past medical, past surgical, and family history.     Objective :  Vital signs: BP 130/84 (BP Location: Left arm, BP Patient Position: Sitting)   Pulse 84   Temp 98 F (36.7 C) (Temporal)   Resp 16   Ht 5\' 8"  (1.727 m)   Wt 83.5 kg (184 lb)   SpO2 99%   BMI 27.98 kg/m     Physical Exam     Physical Exam  Constitutional:       Appearance: Normal appearance.   HENT:      Head: Normocephalic and atraumatic.      Right Ear: Tympanic  membrane normal.      Left Ear: Tympanic membrane normal.      Nose: Nose normal.   Eyes:      Extraocular Movements: Extraocular movements intact.      Conjunctiva/sclera: Conjunctivae normal.      Pupils: Pupils are equal, round, and reactive to light.   Cardiovascular:      Rate and Rhythm: Normal rate and regular rhythm.      Pulses: Normal pulses.      Heart sounds: Normal heart sounds.   Pulmonary:      Effort: Pulmonary effort is normal.      Breath sounds: Normal breath sounds.   Abdominal:      General: Abdomen is flat. Bowel sounds are normal.      Palpations: Abdomen is soft.   Musculoskeletal:         General: Normal range of motion.      Cervical back: Normal range of motion and neck supple.   Skin:     General: Skin is warm and  dry.   Neurological:      General: No focal deficit present.      Mental Status: He is alert and oriented to person, place, and time. Mental status is at baseline.   Psychiatric:         Mood and Affect: Mood normal.         Thought Content: Thought content normal.         Judgment: Judgment normal.       Assessment/Plan:  Matthew Estes was seen today for diabetes.    Diagnoses and all orders for this visit:    Controlled type 2 diabetes mellitus without complication, with long-term current use of insulin (CMS-HCC)  -     Glycosylated Hgb(A1C), Blood Lavender  -     Lipid Panel Green Plasma Separator Tube  -     Vitamin D, 25-OH Total Yellow serum separator tube; Future  -     Liver Panel, Blood Green Plasma Separator Tube; Future  -     Vitamin D, 25-OH Total Yellow serum separator tube  -     Liver Panel, Blood Green Plasma Separator Tube  -     Consult/Referral to Ophthalmology  -     DME Misc Order: [facility] Test strips    Mixed diabetic hyperlipidemia associated with type 2 diabetes mellitus (CMS-HCC)  -     Glycosylated Hgb(A1C), Blood Lavender  -     Lipid Panel Green Plasma Separator Tube  -     Vitamin D, 25-OH Total Yellow serum separator tube; Future  -     Vitamin D, 25-OH  Total Yellow serum separator tube    Nutrition and healthy food. Exercise. low fat diet. high fiber diet. Take medications as directed. Emergency room instructions were given.  Follow up in 30 days.

## 2022-01-30 LAB — NON HDL CHOLESTEROL -QUEST: Non-HDL Cholesterol: 117 mg/dL (calc) (ref <130)

## 2022-01-30 LAB — LIVER PANEL, BLOOD
ALT (SGPT): 23 U/L (ref 9–46)
AST (SGOT): 22 U/L (ref 10–35)
Albumin/Glob Ratio: 1.7 (calc) (ref 1.0–2.5)
Albumin: 4.3 g/dL (ref 3.6–5.1)
Alkaline Phos: 80 U/L (ref 35–144)
Bilirubin, Dir: 0.1 mg/dL (ref <=0.2)
Bilirubin, Indirect: 0.4 mg/dL (calc) (ref 0.2–1.2)
Bilirubin, Total: 0.5 mg/dL (ref 0.2–1.2)
Globulin: 2.6 g/dL (calc) (ref 1.9–3.7)
Total Protein: 6.9 g/dL (ref 6.1–8.1)

## 2022-01-30 LAB — CHOLESTEROL, TOTAL BLOOD: Cholesterol: 177 mg/dL (ref <200)

## 2022-01-30 LAB — CHOLESTEROL/HDLC RATIO-QUEST: Chol/HDLC Ratio: 3 (calc) (ref <5.0)

## 2022-01-30 LAB — VITAMIN D, 25-OH TOTAL: Vitamin D, 25-OH, Total: 32 ng/mL (ref 30–100)

## 2022-01-30 LAB — TRIGLYCERIDES, BLOOD: Triglycerides: 261 mg/dL — ABNORMAL HIGH (ref <150)

## 2022-01-30 LAB — LDL CHOLESTEROL, DIRECT: LDL-Cholesterol: 83 mg/dL (calc)

## 2022-01-30 LAB — GLYCOSYLATED HGB(A1C), BLOOD: Hgb A1C: 6.6 % of total Hgb — ABNORMAL HIGH (ref <5.7)

## 2022-01-30 LAB — HDL-CHOLESTEROL, BLOOD: HDL Cholesterol: 60 mg/dL (ref >=40)

## 2022-01-31 ENCOUNTER — Other Ambulatory Visit (INDEPENDENT_AMBULATORY_CARE_PROVIDER_SITE_OTHER): Payer: Self-pay | Admitting: Emergency Medicine

## 2022-01-31 DIAGNOSIS — E119 Type 2 diabetes mellitus without complications: Secondary | ICD-10-CM

## 2022-02-04 ENCOUNTER — Other Ambulatory Visit: Payer: Self-pay | Admitting: Internal Medicine

## 2022-02-05 MED ORDER — TRUE METRIX BLOOD GLUCOSE TEST VI STRP
ORAL_STRIP | Freq: Three times a day (TID) | 99 refills | Status: DC
Start: 2022-02-05 — End: 2023-02-14

## 2022-02-21 ENCOUNTER — Telehealth: Payer: Self-pay

## 2022-02-21 ENCOUNTER — Encounter (HOSPITAL_COMMUNITY): Payer: Self-pay

## 2022-02-21 ENCOUNTER — Encounter: Payer: Self-pay | Admitting: Internal Medicine

## 2022-02-21 ENCOUNTER — Other Ambulatory Visit: Payer: Self-pay

## 2022-02-21 ENCOUNTER — Emergency Department (HOSPITAL_COMMUNITY)
Admission: EM | Admit: 2022-02-21 | Discharge: 2022-02-21 | Disposition: A | Discharge status: Home / Self Care | Payer: 59 | Attending: Student | Admitting: Student

## 2022-02-21 DIAGNOSIS — E119 Type 2 diabetes mellitus without complications: Secondary | ICD-10-CM | POA: Insufficient documentation

## 2022-02-21 DIAGNOSIS — I10 Essential (primary) hypertension: Secondary | ICD-10-CM | POA: Diagnosis not present

## 2022-02-21 DIAGNOSIS — Z79899 Other long term (current) drug therapy: Secondary | ICD-10-CM | POA: Insufficient documentation

## 2022-02-21 DIAGNOSIS — R220 Localized swelling, mass and lump, head: Secondary | ICD-10-CM | POA: Insufficient documentation

## 2022-02-21 DIAGNOSIS — T783XXA Angioneurotic edema, initial encounter: Secondary | ICD-10-CM

## 2022-02-21 LAB — CBC WITH DIFFERENTIAL/PLATELET
Abs Immature Granulocytes: 0.08 10*3/uL — ABNORMAL HIGH (ref 0.00–0.07)
Basophils Absolute: 0.1 10*3/uL (ref 0.0–0.1)
Basophils Relative: 0 %
Eosinophils Absolute: 0.6 10*3/uL — ABNORMAL HIGH (ref 0.0–0.5)
Eosinophils Relative: 3 %
HCT: 50 % (ref 39.0–52.0)
Hemoglobin: 16.3 g/dL (ref 13.0–17.0)
Immature Granulocytes: 0 %
Lymphocytes Relative: 36 %
Lymphs Abs: 6.9 10*3/uL — ABNORMAL HIGH (ref 0.7–4.0)
MCH: 27.1 pg (ref 26.0–34.0)
MCHC: 32.6 g/dL (ref 30.0–36.0)
MCV: 83.1 fL (ref 80.0–100.0)
Monocytes Absolute: 1.6 10*3/uL — ABNORMAL HIGH (ref 0.1–1.0)
Monocytes Relative: 8 %
Neutro Abs: 9.9 10*3/uL — ABNORMAL HIGH (ref 1.7–7.7)
Neutrophils Relative %: 53 %
Platelets: 114 10*3/uL — ABNORMAL LOW (ref 150–400)
RBC: 6.02 MIL/uL — ABNORMAL HIGH (ref 4.22–5.81)
RDW: 13.8 % (ref 11.5–15.5)
WBC: 19.1 10*3/uL — ABNORMAL HIGH (ref 4.0–10.5)
nRBC: 0 % (ref 0.0–0.2)

## 2022-02-21 LAB — COMPREHENSIVE METABOLIC PANEL
ALT: 39 U/L (ref 0–44)
AST: 26 U/L (ref 15–41)
Albumin: 4 g/dL (ref 3.5–5.0)
Alkaline Phosphatase: 71 U/L (ref 38–126)
Anion gap: 9 (ref 5–15)
BUN: 13 mg/dL (ref 6–20)
CO2: 30 mmol/L (ref 22–32)
Calcium: 9.4 mg/dL (ref 8.9–10.3)
Chloride: 101 mmol/L (ref 98–111)
Creatinine, Ser: 1.11 mg/dL (ref 0.61–1.24)
GFR, Estimated: >60 mL/min (ref >60)
Glucose, Bld: 159 mg/dL — ABNORMAL HIGH (ref 70–99)
Potassium: 3.8 mmol/L (ref 3.5–5.1)
Sodium: 140 mmol/L (ref 135–145)
Total Bilirubin: 0.7 mg/dL (ref 0.3–1.2)
Total Protein: 7.4 g/dL (ref 6.5–8.1)

## 2022-02-21 MED ORDER — STERILE WATER FOR INJECTION IJ SOLN
INTRAMUSCULAR | Status: AC
  Filled 2022-02-21: qty 10

## 2022-02-21 MED ORDER — DIPHENHYDRAMINE HCL 50 MG/ML IJ SOLN
25.0000 mg | Freq: Once | INTRAMUSCULAR | Status: AC
Start: 2022-02-21 — End: 2022-02-21
  Administered 2022-02-21: 25 mg via INTRAVENOUS
  Filled 2022-02-21: qty 1

## 2022-02-21 MED ORDER — FAMOTIDINE IN NACL 20-0.9 MG/50ML-% IV SOLN
20.0000 mg | Freq: Once | INTRAVENOUS | Status: AC
  Administered 2022-02-21: 20 mg via INTRAVENOUS
  Filled 2022-02-21: qty 50

## 2022-02-21 MED ORDER — EPINEPHRINE 0.3 MG/0.3ML IJ SOAJ
0.3000 mg | Freq: Once | INTRAMUSCULAR | Status: AC
Start: 2022-02-21 — End: 2022-02-21
  Administered 2022-02-21: 0.3 mg via INTRAMUSCULAR
  Filled 2022-02-21: qty 0.3

## 2022-02-21 MED ORDER — EPINEPHRINE 0.3 MG/0.3ML IJ SOAJ: 0.3000 mg | INTRAMUSCULAR | 0 refills | Status: DC | PRN

## 2022-02-21 MED ORDER — METHYLPREDNISOLONE SODIUM SUCC 125 MG IJ SOLR
125.0000 mg | Freq: Once | INTRAMUSCULAR | Status: AC
  Administered 2022-02-21: 125 mg via INTRAVENOUS
  Filled 2022-02-21: qty 2

## 2022-02-21 NOTE — ED Provider Notes (Signed)
Care of patient received from prior provider at 0 700.  Please see their note for complete H&P and physical exam. In summary, patient with longstanding history of allergic reactions and complex angioedema who used to follow with allergy but was lost to follow-up presenting with chief complaint of facial swelling.  He was treated as anaphylaxis this morning with epinephrine, Solu-Medrol administration with overall symptomatic improvement and plan for 4-hour observation. Patient was reobserved at bedside and reevaluated.  He has had complete symptomatic resolution.  He is now ambulatory tolerating p.o. intake in no acute distress.  We will prescribe patient epinephrine and recommend that he follow-up with allergy and PCP. Patient stable for continued outpatient care and management at this time.   Tretha Sciara, MD 02/21/22 1505

## 2022-02-21 NOTE — Telephone Encounter (Signed)
Pt called today- he was just seen in the ER for allergic reaction to unknown cause. ED provider recommended he be seen by allergist. Urgent referral placed.

## 2022-02-21 NOTE — ED Triage Notes (Signed)
Pt reports with a hoarse voice, swollen lip, and puffy eyes, since waking up this morning.

## 2022-02-21 NOTE — ED Provider Notes (Signed)
[facility] DEPT Provider Note  CSN: [id number] Arrival date & time: 02/21/22 0505  Chief Complaint(s) Allergic Reaction  HPI Bryan Shaw is a 56 y.o. male with T2DM, HTN, GERD, previous history of angioedema who presents emergency department for evaluation of facial swelling.  Patient states that he awoke this morning and took a Tylenol, went back to sleep and awoke with a swollen right side of his face, upper lip and feelings of a sore scratchy throat.  He states that he feels like he has to continually clear his throat.  He endorses some mild shortness of breath but denies wheezing, pruritus, rash or any new foods or allergens this morning.  Denies chest pain, abdominal pain, nausea, vomiting or other systemic symptoms.   Past Medical History Past Medical History:  Diagnosis Date   Diabetes mellitus, type 2 ([place])    Essential hypertension 12/31/2019   GERD (gastroesophageal reflux disease)    Positive PPD 1999   INH x 6 months   PPD negative 2001   Patient Active Problem List   Diagnosis Date Noted   Diabetes mellitus without complication ([place]) 81/19/1478   Essential hypertension 12/31/2019   PCP NOTES >>>>>>>>>>>>>> 10/25/2016   Annual physical exam 11/21/2010   LIPOMAS, MULTIPLE 09/27/2009   GERD 05/25/2008   Home Medication(s) Prior to Admission medications   Medication Sig Start Date End Date Taking? Authorizing Provider  atorvastatin (LIPITOR) 20 MG tablet TAKE 1 TABLET(20 MG) BY MOUTH AT BEDTIME 02/05/22  Yes Paz, Alda Berthold, MD  losartan-hydrochlorothiazide (HYZAAR) 50-12.5 MG tablet Take 1 tablet by mouth daily. 07/24/21  Yes Paz, Alda Berthold, MD  omeprazole (PRILOSEC) 20 MG capsule Take 1 capsule (20 mg total) by mouth daily. Patient taking differently: Take 20 mg by mouth daily as needed (for acid reflux). 09/05/18  Yes Orvan July, NP                                                                                                                                     Past Surgical History Past Surgical History:  Procedure Laterality Date   NO PAST SURGERIES     Family History Family History  Problem Relation Age of Onset   Pancreatic cancer Sister    Diabetes Neg Hx    Colon cancer Neg Hx    Hypertension Neg Hx    Coronary artery disease Neg Hx    Stroke Neg Hx    Prostate cancer Neg Hx     Social History Social History   Tobacco Use   Smoking status: Never   Smokeless tobacco: Never  Substance Use Topics   Alcohol use: No   Drug use: No   Allergies Patient has no known allergies.  Review of Systems Review of Systems  HENT:  Positive for facial swelling, sore throat and trouble swallowing.     Physical Exam Vital Signs  I have reviewed the triage  vital signs BP (!) 172/93 (BP Location: Left Arm)   Pulse 79   Temp 97.7 F (36.5 C) (Oral)   Resp 18   Ht '5\' 10"'$  (1.778 m)   Wt 99.8 kg   SpO2 96%   BMI 31.57 kg/m   Physical Exam Vitals and nursing note reviewed.  Constitutional:      General: He is not in acute distress.    Appearance: He is well-developed.  HENT:     Head: Normocephalic and atraumatic.     Comments: Small amount of uvular swelling Eyes:     Conjunctiva/sclera: Conjunctivae normal.  Cardiovascular:     Rate and Rhythm: Normal rate and regular rhythm.     Heart sounds: No murmur heard. Pulmonary:     Effort: Pulmonary effort is normal. No respiratory distress.     Breath sounds: Normal breath sounds.  Abdominal:     Palpations: Abdomen is soft.     Tenderness: There is no abdominal tenderness.  Musculoskeletal:        General: No swelling.     Cervical back: Neck supple.  Skin:    General: Skin is warm and dry.     Capillary Refill: Capillary refill takes less than 2 seconds.  Neurological:     Mental Status: He is alert.  Psychiatric:        Mood and Affect: Mood normal.     ED Results and Treatments Labs (all labs ordered are listed, but only abnormal results  are displayed) Labs Reviewed  CBC WITH DIFFERENTIAL/PLATELET - Abnormal; Notable for the following components:      Result Value   WBC 19.1 (*)    RBC 6.02 (*)    Platelets 114 (*)    Neutro Abs 9.9 (*)    Lymphs Abs 6.9 (*)    Monocytes Absolute 1.6 (*)    Eosinophils Absolute 0.6 (*)    Abs Immature Granulocytes 0.08 (*)    All other components within normal limits  TRYPTASE  C1 ESTERASE INHIBITOR  C3 COMPLEMENT  C4 COMPLEMENT  COMPREHENSIVE METABOLIC PANEL                                                                                                                          Radiology No results found.  Pertinent labs & imaging results that were available during my care of the patient were reviewed by me and considered in my medical decision making (see MDM for details).  Medications Ordered in ED Medications  famotidine (PEPCID) IVPB 20 mg premix (has no administration in time range)  sterile water (preservative free) injection (has no administration in time range)  EPINEPHrine (EPI-PEN) injection 0.3 mg (0.3 mg Intramuscular Given 02/21/22 0527)  methylPREDNISolone sodium succinate (SOLU-MEDROL) 125 mg/2 mL injection 125 mg (125 mg Intravenous Given 02/21/22 0540)  diphenhydrAMINE (BENADRYL) injection 25 mg (25 mg Intravenous Given 02/21/22 0541)  Procedures .Critical Care  Performed by: Teressa Lower, MD Authorized by: Teressa Lower, MD   Critical care provider statement:    Critical care time (minutes):  30   Critical care was necessary to treat or prevent imminent or life-threatening deterioration of the following conditions: Facial swelling requiring epinephrine.   Critical care was time spent personally by me on the following activities:  Development of treatment plan with patient or surrogate, discussions with consultants, evaluation  of patient's response to treatment, examination of patient, ordering and review of laboratory studies, ordering and review of radiographic studies, ordering and performing treatments and interventions, pulse oximetry, re-evaluation of patient's condition and review of old charts   (including critical care time)  Medical Decision Making / ED Course   This patient presents to the ED for concern of facial swelling, this involves an extensive number of treatment options, and is a complaint that carries with it a high risk of complications and morbidity.  The differential diagnosis includes anaphylaxis, angioedema, Quincke's disease, periorbital cellulitis  MDM: Seen emergency room for evaluation of facial swelling.  Physical exam with unilateral right-sided ocular and upper lip swelling and a small amount of uvular swelling.  No wheezing on lung exam.  In the setting of difficulty swallowing and facial swelling, patient meets anaphylaxis criteria and as presentation is undifferentiated between anaphylaxis and angioedema, and epinephrine autoinjector was administered along with steroids and Benadryl patient did have some improvement.  His laboratory evaluation is pending at time of signout but patient will require 4-hour observation with anticipated discharge time 9:30 AM on 02/21/2021 if symptoms continue to improve.  Patient then signed out to oncoming provider.  Please see provider signout for continuation of work-up.   Additional history obtained: -Additional history obtained from multiple family members -External records from outside source obtained and reviewed including: Chart review including previous notes, labs, imaging, consultation notes   Lab Tests: -I ordered, reviewed, and interpreted labs.   The pertinent results include:   Labs Reviewed  CBC WITH DIFFERENTIAL/PLATELET - Abnormal; Notable for the following components:      Result Value   WBC 19.1 (*)    RBC 6.02 (*)    Platelets  114 (*)    Neutro Abs 9.9 (*)    Lymphs Abs 6.9 (*)    Monocytes Absolute 1.6 (*)    Eosinophils Absolute 0.6 (*)    Abs Immature Granulocytes 0.08 (*)    All other components within normal limits  TRYPTASE  C1 ESTERASE INHIBITOR  C3 COMPLEMENT  C4 COMPLEMENT  COMPREHENSIVE METABOLIC PANEL     Medicines ordered and prescription drug management: Meds ordered this encounter  Medications   EPINEPHrine (EPI-PEN) injection 0.3 mg   methylPREDNISolone sodium succinate (SOLU-MEDROL) 125 mg/2 mL injection 125 mg   famotidine (PEPCID) IVPB 20 mg premix   sterile water (preservative free) injection    Elpidio Eric M: cabinet override   diphenhydrAMINE (BENADRYL) injection 25 mg    -I have reviewed the patients home medicines and have made adjustments as needed  Critical interventions Epinephrine administration, steroids   Cardiac Monitoring: The patient was maintained on a cardiac monitor.  I personally viewed and interpreted the cardiac monitored which showed an underlying rhythm of: NSR  Social Determinants of Health:  Factors impacting patients care include: none   Reevaluation: After the interventions noted above, I reevaluated the patient and found that they have :improved  Co morbidities that complicate the patient evaluation  Past Medical History:  Diagnosis  Date   Diabetes mellitus, type 2 ([place])    Essential hypertension 12/31/2019   GERD (gastroesophageal reflux disease)    Positive PPD 1999   INH x 6 months   PPD negative 2001      Dispostion: I considered admission for this patient, and disposition is pending ED observation stay and return of labs.  See provider signout for continuation of work-up     Final Clinical Impression(s) / ED Diagnoses Final diagnoses:  None     '@PCDICTATION'$ @    Sakiya Stepka, Debe Coder, MD 02/21/22 [phone]

## 2022-02-22 LAB — C1 ESTERASE INHIBITOR: C1INH SerPl-mCnc: 28 mg/dL (ref 21–39)

## 2022-02-22 LAB — C3 COMPLEMENT: C3 Complement: 130 mg/dL (ref 82–167)

## 2022-02-22 LAB — TRYPTASE: Tryptase: 12.4 ug/L (ref 2.2–13.2)

## 2022-02-22 LAB — C4 COMPLEMENT: Complement C4, Body Fluid: 24 mg/dL (ref 12–38)

## 2022-04-18 ENCOUNTER — Other Ambulatory Visit: Payer: Self-pay | Admitting: Internal Medicine

## 2022-04-19 ENCOUNTER — Encounter: Payer: Self-pay | Admitting: Internal Medicine

## 2022-04-19 ENCOUNTER — Ambulatory Visit (INDEPENDENT_AMBULATORY_CARE_PROVIDER_SITE_OTHER): Payer: 59 | Admitting: Internal Medicine

## 2022-04-19 VITALS — BP 130/78 | HR 59 | Temp 98.3°F | Resp 16 | Ht 70.0 in | Wt 219.1 lb

## 2022-04-19 DIAGNOSIS — T783XXD Angioneurotic edema, subsequent encounter: Secondary | ICD-10-CM | POA: Diagnosis not present

## 2022-04-19 DIAGNOSIS — Z23 Encounter for immunization: Secondary | ICD-10-CM

## 2022-04-19 DIAGNOSIS — I10 Essential (primary) hypertension: Secondary | ICD-10-CM | POA: Diagnosis not present

## 2022-04-19 DIAGNOSIS — E119 Type 2 diabetes mellitus without complications: Secondary | ICD-10-CM

## 2022-04-19 LAB — MICROALBUMIN / CREATININE URINE RATIO
Creatinine,U: 112.9 mg/dL
Microalb Creat Ratio: 0.6 mg/g (ref 0.0–30.0)
Microalb, Ur: <0.7 mg/dL (ref 0.0–1.9)

## 2022-04-19 LAB — CBC WITH DIFFERENTIAL/PLATELET
Basophils Absolute: 0 10*3/uL (ref 0.0–0.1)
Basophils Relative: 0.5 % (ref 0.0–3.0)
Eosinophils Absolute: 0.3 10*3/uL (ref 0.0–0.7)
Eosinophils Relative: 3 % (ref 0.0–5.0)
HCT: 48.4 % (ref 39.0–52.0)
Hemoglobin: 15.7 g/dL (ref 13.0–17.0)
Lymphocytes Relative: 32.7 % (ref 12.0–46.0)
Lymphs Abs: 3.2 10*3/uL (ref 0.7–4.0)
MCHC: 32.5 g/dL (ref 30.0–36.0)
MCV: 83.2 fl (ref 78.0–100.0)
Monocytes Absolute: 0.7 10*3/uL (ref 0.1–1.0)
Monocytes Relative: 7 % (ref 3.0–12.0)
Neutro Abs: 5.6 10*3/uL (ref 1.4–7.7)
Neutrophils Relative %: 56.8 % (ref 43.0–77.0)
Platelets: 210 10*3/uL (ref 150.0–400.0)
RBC: 5.82 Mil/uL — ABNORMAL HIGH (ref 4.22–5.81)
RDW: 15.3 % (ref 11.5–15.5)
WBC: 9.8 10*3/uL (ref 4.0–10.5)

## 2022-04-19 LAB — HEMOGLOBIN A1C: Hgb A1c MFr Bld: 7.3 % — ABNORMAL HIGH (ref 4.6–6.5)

## 2022-04-19 MED ORDER — AMLODIPINE BESYLATE 5 MG PO TABS: 5.0000 mg | ORAL_TABLET | Freq: Every day | ORAL | 0 refills | Status: DC

## 2022-04-19 MED ORDER — HYDROCHLOROTHIAZIDE 12.5 MG PO TABS: 12.5000 mg | ORAL_TABLET | Freq: Every day | ORAL | 0 refills | Status: DC

## 2022-04-19 NOTE — Progress Notes (Signed)
Subjective:    Patient ID: Bryan Shaw, male    DOB: 09-22-1965, 56 y.o.   MRN: [id number]  DOS:  04/19/2022 Type of visit - description: Follow-up  Went to the ER February 21, 2022. Chief complaint was facial and lip swelling, right side, sore/scratchy throat. + Mild shortness of breath but no wheezing. He had a small amount of uvular swelling. Multiple labs ordered, they were okay except for slightly increased WBC and decreased platelets.  Since the ER visit, had several episodes of whelps.  He took pictures, some located at the groin. Also  at least 2 episodes of lip swelling. Symptoms are typically at night.  His food intake is around 4 PM.  Denies fever chills No weight loss No nausea vomiting. No cough No tick bites  Review of Systems See above   Past Medical History:  Diagnosis Date   Diabetes mellitus, type 2 ([place])    Essential hypertension 12/31/2019   GERD (gastroesophageal reflux disease)    Positive PPD 1999   INH x 6 months   PPD negative 2001    Past Surgical History:  Procedure Laterality Date   NO PAST SURGERIES      Current Outpatient Medications  Medication Instructions   atorvastatin (LIPITOR) 20 MG tablet TAKE 1 TABLET(20 MG) BY MOUTH AT BEDTIME   diphenhydrAMINE (BENADRYL) 25 mg, Oral, 3 times daily PRN   EPINEPHrine (EPI-PEN) 0.3 mg, Intramuscular, As needed   losartan-hydrochlorothiazide (HYZAAR) 50-12.5 MG tablet 1 tablet, Oral, Daily   omeprazole (PRILOSEC) 20 mg, Oral, Daily       Objective:   Physical Exam BP 130/78   Pulse (!) 59   Temp 98.3 F (36.8 C) (Oral)   Resp 16   Ht '5\' 10"'$  (1.778 m)   Wt 219 lb 2 oz (99.4 kg)   SpO2 97%   BMI 31.44 kg/m  General:   Well developed, NAD, BMI noted. HEENT:  Normocephalic . Face symmetric, atraumatic Lungs:  CTA B Normal respiratory effort, no intercostal retractions, no accessory muscle use. Heart: RRR,  no murmur.  Lower extremities: no pretibial edema bilaterally  Skin:  Not pale. Not jaundice Neurologic:  alert & oriented X3.  Speech normal, gait appropriate for age and unassisted Psych--  Cognition and judgment appear intact.  Cooperative with normal attention span and concentration.  Behavior appropriate. No anxious or depressed appearing.      Assessment     Assessment DM Dx/2022 HTN (started med 10/2019) GERD Multiple lipomas Angioedema PPD +1999, s/p INH. PPD (-) 2001  PLAN:  Angioedema: Unknown to me, he had a history of whelps (also angioedema?)  Years ago, saw an allergist at the time.  Issue has been dormant until recently when he went to the ER with angioedema. Since the ER visit 01-2022, he continues to have reactions including to episodes of lip swelling.  Is his observation that if he takes Benadryl before meals he does not develop symptoms. Plan: Continue Benadryl, keep a EpiPen close by, ER if symptoms severe Keep appointment with allergist Will stop Hyzaar (symptoms related to losartan?). HTN: See above, stop Hyzaar, start amlodipine, HCTZ, monitor BPs, BMP in 2 weeks DM: Last A1c 7.0, he is doing better with diet, he is active at work, recheck A1c.  Consider hold medicines until etiology of angioedema is clarified. Elevated WBCs: At the ER, recheck CBC Preventive care: Flu shot (egg free).  Avoid other vaccines for now until angioedema etiology is clarified RTC 3 months

## 2022-04-19 NOTE — Patient Instructions (Addendum)
Stopped losartan and HCTZ  Start amlodipine 5 mg 1 tablet daily  Hydrochlorothiazide 12.5 mg 1 tablet daily  Check the  blood pressure regularly BP GOAL is between 110/65 and  135/85. If it is consistently higher or lower, let me know  Continue Benadryl, EpiPen as needed, ER if severe symptoms. Keep the appointment with your allergist    GO TO THE LAB : Get the blood work     [place], [place] Come back for blood work in 2 weeks  Come back for checkup in 3 months

## 2022-04-20 NOTE — Assessment & Plan Note (Signed)
Angioedema: Unknown to me, he had a history of whelps (also angioedema?)  Years ago, saw an allergist at the time.  Issue has been dormant until recently when he went to the ER with angioedema. Since the ER visit 01-2022, he continues to have reactions including to episodes of lip swelling.  Is his observation that if he takes Benadryl before meals he does not develop symptoms. Plan: Continue Benadryl, keep a EpiPen close by, ER if symptoms severe Keep appointment with allergist Will stop Hyzaar (symptoms related to losartan?). HTN: See above, stop Hyzaar, start amlodipine, HCTZ, monitor BPs, BMP in 2 weeks DM: Last A1c 7.0, he is doing better with diet, he is active at work, recheck A1c.  Consider hold medicines until etiology of angioedema is clarified. Elevated WBCs: At the ER, recheck CBC Preventive care: Flu shot (egg free).  Avoid other vaccines for now until angioedema etiology is clarified RTC 3 months

## 2022-05-01 ENCOUNTER — Encounter: Payer: Self-pay | Admitting: Allergy and Immunology

## 2022-05-01 ENCOUNTER — Other Ambulatory Visit: Payer: Self-pay

## 2022-05-01 ENCOUNTER — Ambulatory Visit (INDEPENDENT_AMBULATORY_CARE_PROVIDER_SITE_OTHER): Payer: 59 | Admitting: Allergy and Immunology

## 2022-05-01 VITALS — BP 120/76 | HR 65 | Temp 98.2°F | Resp 20 | Ht 68.0 in | Wt 215.6 lb

## 2022-05-01 DIAGNOSIS — L500 Allergic urticaria: Secondary | ICD-10-CM

## 2022-05-01 DIAGNOSIS — T7840XD Allergy, unspecified, subsequent encounter: Secondary | ICD-10-CM

## 2022-05-01 DIAGNOSIS — T782XXD Anaphylactic shock, unspecified, subsequent encounter: Secondary | ICD-10-CM

## 2022-05-01 DIAGNOSIS — T783XXD Angioneurotic edema, subsequent encounter: Secondary | ICD-10-CM

## 2022-05-01 MED ORDER — CETIRIZINE HCL 10 MG PO TABS: 10.0000 mg | ORAL_TABLET | Freq: Two times a day (BID) | ORAL | 5 refills | Status: AC

## 2022-05-01 MED ORDER — MONTELUKAST SODIUM 10 MG PO TABS: 10.0000 mg | ORAL_TABLET | Freq: Every day | ORAL | 1 refills | Status: DC

## 2022-05-01 MED ORDER — FAMOTIDINE 20 MG PO TABS: 20.0000 mg | ORAL_TABLET | Freq: Two times a day (BID) | ORAL | 5 refills | Status: DC

## 2022-05-01 NOTE — Patient Instructions (Addendum)
  1.  Allergen avoidance measures?  2.  Treat and prevent immune activation:   A.  Cetirizine 10 mg - 1-2 tablets 2 times per day (max = 40 mg/day)  B.  Famotidine 20 mg - 1 tablet 2 times per day  C.  Montelukast 10 mg - 1 tablet 1 time per day  3.  If needed:   A.  EpiPen, Benadryl, MD/ER evaluation for allergic reaction  4.  Blood -alpha-gal panel, milk panel with reflex, TSH, FT4, thyroid peroxidase antibody  5.  Obtain fall flu vaccine  6.  Return to clinic in 4 weeks or earlier if problem

## 2022-05-01 NOTE — Progress Notes (Signed)
[facility] [place] [facility] [place] [facility] [place]   Dear Dr Larose Kells,  Thank you for referring Bryan Shaw to the [facility] on 05/01/2022.   Below is a summation of this patient's evaluation and recommendations.  Thank you for your referral. I will keep you informed about this patient's response to treatment.   If you have any questions please do not hesitate to contact me.   Sincerely,  Jiles Prows, MD Allergy / Immunology [facility]   ______________________________________________________________________    NEW PATIENT NOTE  Referring Provider: Colon Branch, MD Primary Provider: Colon Branch, MD Date of office visit: 05/01/2022    Subjective:   Chief Complaint:  Bryan Shaw (DOB: 1965-12-23) is a 56 y.o. male who presents to the clinic on 05/01/2022 with a chief complaint of Urticaria, Pruritus, and Allergic Reaction .     HPI: [place] presents to this clinic in evaluation of recurrent allergic reactions.  Since mid August 2023 he has been having recurrent episodes of red raised itchy lesions across his body, lip swelling, periorbital swelling, and he had an episode of throat swelling where he was unable to breathe and talk.  His initial episode was the episode that was associated with his inability to breathe and talk and he went to the urgent care center and received a systemic steroid and epinephrine.  Since that point in time he has been using Benadryl on most days to control this issue yet it is still somewhat active.  Fortunately, he has not had any problems with his throat but still continues to have recurrent hives and occasional lip swelling and some eye swelling.  He has no associated systemic or constitutional symptoms.  There is not really an obvious provoking factor giving rise to this issue.  He did start some type of supplement 3 months prior to  August 2023 but he discontinued this agent in August.  As well, he discontinued consumption of all beef and all mammal in August 2023 when this reaction occurred.  As well, he had discontinuation of his losartan in October 2023.  He has not started any other new supplements or health foods or energy boosters or herbs.  He has not received any additional medications.  He did not start any new hobbies where he was exposed to new particulate matter.  He does not have symptoms suggesting an ongoing infectious disease.  He states that approximately 20 years ago he had a similar episode of urticaria and angioedema that appeared to last for several months and spontaneously resolved.  Past Medical History:  Diagnosis Date   Angio-edema    Diabetes mellitus, type 2 ([place])    Essential hypertension 12/31/2019   GERD (gastroesophageal reflux disease)    Positive PPD 1999   INH x 6 months   PPD negative 2001   Urticaria     Past Surgical History:  Procedure Laterality Date   NO PAST SURGERIES      Allergies as of 05/01/2022   No Known Allergies      Medication List    amLODipine 5 MG tablet Commonly known as: NORVASC Take 1 tablet (5 mg total) by mouth daily.   atorvastatin 20 MG tablet Commonly known as: LIPITOR TAKE 1 TABLET(20 MG) BY MOUTH AT BEDTIME   diphenhydrAMINE 12.5 MG/5ML liquid Commonly known as: BENADRYL Take 25 mg by mouth 3 (three) times daily as  needed for itching or allergies.   EPINEPHrine 0.3 mg/0.3 mL Soaj injection Commonly known as: EPI-PEN Inject 0.3 mg into the muscle as needed for anaphylaxis.   hydrochlorothiazide 12.5 MG tablet Commonly known as: HYDRODIURIL Take 1 tablet (12.5 mg total) by mouth daily.   omeprazole 20 MG capsule Commonly known as: PRILOSEC Take 1 capsule (20 mg total) by mouth daily. What changed:     Review of systems negative except as noted in HPI / PMHx or noted below:  Review of Systems  Constitutional: Negative.   HENT:  Negative.    Eyes: Negative.   Respiratory: Negative.    Cardiovascular: Negative.   Gastrointestinal: Negative.   Genitourinary: Negative.   Musculoskeletal: Negative.   Skin: Negative.   Neurological: Negative.   Endo/Heme/Allergies: Negative.   Psychiatric/Behavioral: Negative.      Family History  Problem Relation Age of Onset   Pancreatic cancer Sister    Diabetes Neg Hx    Colon cancer Neg Hx    Hypertension Neg Hx    Coronary artery disease Neg Hx    Stroke Neg Hx    Prostate cancer Neg Hx     Social History   Socioeconomic History   Marital status: Married    Spouse name: Not on file   Number of children: 1   Years of education: Not on file   Highest education level: Not on file  Occupational History   Occupation: Economist  [employer]  Tobacco Use   Smoking status: Never   Smokeless tobacco: Never  Substance and Sexual Activity   Alcohol use: No   Drug use: No   Sexual activity: Yes    Birth control/protection: None  Other Topics Concern   Not on file  Social History Narrative   From [place], wife from [place]    1 adopted son, adult   Household: pt and wife    Environmental and Social history  Lives in a house with a dry environment, no animals located inside the household, carpet in the bedroom, plastic on the bed, no plastic on the pillow, and no smoking ongoing with inside the household.  He works as a Press photographer controlled.  Objective:   Vitals:   05/01/22 1422  BP: 120/76  Pulse: 65  Resp: 20  Temp: 98.2 F (36.8 C)  SpO2: 96%   Height: '5\' 8"'$  (172.7 cm) Weight: 215 lb 9.6 oz (97.8 kg)  Physical Exam Constitutional:      Appearance: He is not diaphoretic.  HENT:     Head: Normocephalic.     Right Ear: Tympanic membrane, ear canal and external ear normal.     Left Ear: Tympanic membrane, ear canal and external ear normal.     Nose: Nose normal. No mucosal edema or rhinorrhea.     Mouth/Throat:     Pharynx: Uvula midline. No  oropharyngeal exudate.  Eyes:     Conjunctiva/sclera: Conjunctivae normal.  Neck:     Thyroid: No thyromegaly.     Trachea: Trachea normal. No tracheal tenderness or tracheal deviation.  Cardiovascular:     Rate and Rhythm: Normal rate and regular rhythm.     Heart sounds: Normal heart sounds, S1 normal and S2 normal. No murmur heard. Pulmonary:     Effort: No respiratory distress.     Breath sounds: Normal breath sounds. No stridor. No wheezing or rales.  Lymphadenopathy:     Head:     Right side of head: No tonsillar adenopathy.  Left side of head: No tonsillar adenopathy.     Cervical: No cervical adenopathy.  Skin:    Findings: No erythema or rash.     Nails: There is no clubbing.  Neurological:     Mental Status: He is alert.     Diagnostics: Allergy skin tests were performed.  He did did not demonstrate any hypersensitivity against a screening panel of foods.  Results of blood tests obtained 21 February 2022 identifies creatinine 1.11 Mg/DL, AST 26 U/L, ALT 39 U/L, tryptase 12.4 UG/L, C1 esterase inhibitor level 28 mg/DL, C3 130 mg/DL, C4 24 MGs/DL, WBC 19.1, absolute eosinophil 600, absolute lymphocyte 6900, hemoglobin 16.3, platelet 114.  Results of blood tests obtained 19 April 2022 identified WBC 9.8, absolute eosinophil 300, absolute lymphocyte 3200, hemoglobin 15.7, platelet 210.  Assessment and Plan:    1. Anaphylaxis, subsequent encounter   2. Angioedema, subsequent encounter   3. Allergic reaction, subsequent encounter   4. Allergic urticaria     1.  Allergen avoidance measures?  2.  Treat and prevent immune activation:   A.  Cetirizine 10 mg - 1-2 tablets 2 times per day (max = 40 mg/day)  B.  Famotidine 20 mg - 1 tablet 2 times per day  C.  Montelukast 10 mg - 1 tablet 1 time per day  3.  If needed:   A.  EpiPen, Benadryl, MD/ER evaluation for allergic reaction  4.  Blood -alpha-gal panel, milk panel with reflex, TSH, FT4, thyroid peroxidase  antibody  5.  Obtain fall flu vaccine  6.  Return to clinic in 4 weeks or earlier if problem  Duilio has some form of immunological hyperreactivity giving rise to recurrent episodes of urticaria and angioedema and on at least 1 occasion he had significant organ involvement with his angioedema that required the administration of injectable epinephrine.  I am going to have him use a collection of H1 and H2 receptor blocker and a leukotriene modifier to see if we can decrease his immunological hyperactivity and I am going to investigate him further with the blood test noted above to see if we can identify why he has immunological hyperactivity.  When one looks at his initial CBC you can certainly see very significant hematologic abnormalities which may have been reflective of his initial immunological insult but fortunately all of that appear to have corrected.  I will see him back in this clinic in 4 weeks or earlier if there is a problem.   Jiles Prows, MD All[facility]

## 2022-05-03 ENCOUNTER — Other Ambulatory Visit (INDEPENDENT_AMBULATORY_CARE_PROVIDER_SITE_OTHER): Payer: 59

## 2022-05-03 DIAGNOSIS — I10 Essential (primary) hypertension: Secondary | ICD-10-CM | POA: Diagnosis not present

## 2022-05-03 LAB — BASIC METABOLIC PANEL
BUN: 15 mg/dL (ref 6–23)
CO2: 31 mEq/L (ref 19–32)
Calcium: 9.1 mg/dL (ref 8.4–10.5)
Chloride: 103 mEq/L (ref 96–112)
Creatinine, Ser: 1.04 mg/dL (ref 0.40–1.50)
GFR: 81.27 mL/min (ref >60.00)
Glucose, Bld: 127 mg/dL — ABNORMAL HIGH (ref 70–99)
Potassium: 4.2 mEq/L (ref 3.5–5.1)
Sodium: 141 mEq/L (ref 135–145)

## 2022-05-04 LAB — THYROID PEROXIDASE ANTIBODY: Thyroperoxidase Ab SerPl-aCnc: <9 IU/mL (ref 0–34)

## 2022-05-04 LAB — IGE MILK W/ COMPONENT REFLEX: F002-IgE Milk: <0.1 kU/L

## 2022-05-04 LAB — TSH+FREE T4
Free T4: 1.2 ng/dL (ref 0.82–1.77)
TSH: 2.9 u[IU]/mL (ref 0.450–4.500)

## 2022-05-07 ENCOUNTER — Encounter: Payer: Self-pay | Admitting: Allergy and Immunology

## 2022-05-29 ENCOUNTER — Encounter: Payer: Self-pay | Admitting: Allergy and Immunology

## 2022-05-29 ENCOUNTER — Other Ambulatory Visit: Payer: Self-pay

## 2022-05-29 ENCOUNTER — Ambulatory Visit (INDEPENDENT_AMBULATORY_CARE_PROVIDER_SITE_OTHER): Payer: 59 | Admitting: Allergy and Immunology

## 2022-05-29 VITALS — BP 134/86 | HR 61 | Temp 98.4°F | Resp 18 | Ht 68.0 in | Wt 218.9 lb

## 2022-05-29 DIAGNOSIS — T783XXD Angioneurotic edema, subsequent encounter: Secondary | ICD-10-CM

## 2022-05-29 DIAGNOSIS — T782XXD Anaphylactic shock, unspecified, subsequent encounter: Secondary | ICD-10-CM

## 2022-05-29 DIAGNOSIS — L501 Idiopathic urticaria: Secondary | ICD-10-CM

## 2022-05-29 DIAGNOSIS — T7840XD Allergy, unspecified, subsequent encounter: Secondary | ICD-10-CM

## 2022-05-29 NOTE — Patient Instructions (Addendum)
  1.  Treat and prevent immune activation:   A.  Cetirizine 10 mg - 1-2 tablets 1-2 times per day (max = 40 mg/day)  B.  Famotidine 20 mg - 1 tablet 1-2 times per day  C.  Montelukast 10 mg - 1 tablet 1 time per day  2.  If needed:   A.  EpiPen, Benadryl, MD/ER evaluation for allergic reaction  3.  Return to clinic in 12 weeks or earlier if problem

## 2022-05-29 NOTE — Progress Notes (Unsigned)
[name]   Follow-up Note  Referring Provider: Colon Branch, MD Primary Provider: Colon Branch, MD Date of Office Visit: 05/29/2022  Subjective:   Bryan Shaw (DOB: 08/29/1965) is a 56 y.o. male who returns to the [facility] on 05/29/2022 in re-evaluation of the following:  HPI: Bryan Shaw returns to the clinic in evaluation of recurrent allergic reactions.  I last saw him in this clinic during his initial evaluation on 01 May 2022.  He has had no allergic reactions.  He has had no urticaria.  He has no problems with his throat.  He has been consistently using H1 receptor blocker, H2 receptor blocker, and leukotriene modifier.  He is not avoiding any food other than shellfish.  Allergies as of 05/29/2022   No Known Allergies      Medication List    amLODipine 5 MG tablet Commonly known as: NORVASC Take 1 tablet (5 mg total) by mouth daily.   atorvastatin 20 MG tablet Commonly known as: LIPITOR TAKE 1 TABLET(20 MG) BY MOUTH AT BEDTIME   cetirizine 10 MG tablet Commonly known as: ZYRTEC Take 1 tablet (10 mg total) by mouth 2 (two) times daily.   diphenhydrAMINE 12.5 MG/5ML liquid Commonly known as: BENADRYL Take 25 mg by mouth 3 (three) times daily as needed for itching or allergies.   EPINEPHrine 0.3 mg/0.3 mL Soaj injection Commonly known as: EPI-PEN Inject 0.3 mg into the muscle as needed for anaphylaxis.   famotidine 20 MG tablet Commonly known as: PEPCID Take 1 tablet (20 mg total) by mouth 2 (two) times daily.   hydrochlorothiazide 12.5 MG tablet Commonly known as: HYDRODIURIL Take 1 tablet (12.5 mg total) by mouth daily.   montelukast 10 MG tablet Commonly known as: SINGULAIR Take 1 tablet (10 mg total) by mouth at bedtime.   omeprazole 20 MG capsule Commonly known as: PRILOSEC Take 1 capsule (20 mg total) by mouth daily.        Past Medical History:  Diagnosis Date    Angio-edema    Diabetes mellitus, type 2 ([place])    Essential hypertension 12/31/2019   GERD (gastroesophageal reflux disease)    Positive PPD 1999   INH x 6 months   PPD negative 2001   Urticaria     Past Surgical History:  Procedure Laterality Date   NO PAST SURGERIES      Review of systems negative except as noted in HPI / PMHx or noted below:  Review of Systems  Constitutional: Negative.   HENT: Negative.    Eyes: Negative.   Respiratory: Negative.    Cardiovascular: Negative.   Gastrointestinal: Negative.   Genitourinary: Negative.   Musculoskeletal: Negative.   Skin: Negative.   Neurological: Negative.   Endo/Heme/Allergies: Negative.   Psychiatric/Behavioral: Negative.       Objective:   Vitals:   05/29/22 1328  Pulse: 61  Resp: 18  SpO2: 96%   Height: '5\' 8"'$  (172.7 cm)      Physical Exam -deferred  Diagnostics: Results of blood tests obtained 01 May 2022 identified milk IgE less than 0.10 KU/L, TSH 2.90 IU/mL, free T4 1.20 NG/DL, thyroid peroxidase antibody less than 9U/mL, a alpha gal panel was not completed.  Assessment and Plan:   1. Anaphylaxis, subsequent encounter   2. Angioedema, subsequent encounter   3. Allergic reaction, subsequent encounter   4. Idiopathic urticaria     1.  Treat and prevent immune activation:  A.  Cetirizine 10 mg - 1-2 tablets 1-2 times per day (max = 40 mg/day)  B.  Famotidine 20 mg - 1 tablet 1-2 times per day  C.  Montelukast 10 mg - 1 tablet 1 time per day  2.  If needed:   A.  EpiPen, Benadryl, MD/ER evaluation for allergic reaction  3.  Return to clinic in 12 weeks or earlier if problem  [place] appears to be doing quite well on his current plan.  It is quite possible that his immunological hyperreactivity has since resolved.  It is not entirely clear what caused that immunological hyperactivity but I think Bryan Shaw's job at this point in time is to find out the least amount of medications required to  avoid the development of recurrent allergic reactions and he will taper down his cetirizine and famotidine slowly over the course of the next several months to just 1 time per day.  Bryan Katz, MD Allergy / Immunology [facility]

## 2022-05-30 ENCOUNTER — Encounter: Payer: Self-pay | Admitting: Allergy and Immunology

## 2022-07-18 ENCOUNTER — Encounter: Payer: Self-pay | Admitting: Internal Medicine

## 2022-07-18 ENCOUNTER — Ambulatory Visit (INDEPENDENT_AMBULATORY_CARE_PROVIDER_SITE_OTHER): Payer: 59 | Admitting: Internal Medicine

## 2022-07-18 VITALS — BP 126/74 | HR 54 | Temp 97.8°F | Resp 16 | Ht 68.0 in | Wt 222.2 lb

## 2022-07-18 DIAGNOSIS — I10 Essential (primary) hypertension: Secondary | ICD-10-CM | POA: Diagnosis not present

## 2022-07-18 DIAGNOSIS — T783XXD Angioneurotic edema, subsequent encounter: Secondary | ICD-10-CM

## 2022-07-18 DIAGNOSIS — E119 Type 2 diabetes mellitus without complications: Secondary | ICD-10-CM

## 2022-07-18 DIAGNOSIS — T783XXA Angioneurotic edema, initial encounter: Secondary | ICD-10-CM | POA: Insufficient documentation

## 2022-07-18 LAB — HEMOGLOBIN A1C: Hgb A1c MFr Bld: 7.5 % — ABNORMAL HIGH (ref 4.6–6.5)

## 2022-07-18 NOTE — Patient Instructions (Addendum)
For runny nose, use Astepro 2 sprays in each side of the nose twice daily as needed. Astepro is over-the-counter and found in the allergy section  Check the  blood pressure regularly BP GOAL is between 110/65 and  135/85. If it is consistently higher or lower, let me know      GO TO THE LAB : Get the blood work     [place], [place] back for   a physical exam in 3 to 4 months

## 2022-07-18 NOTE — Assessment & Plan Note (Signed)
DM: A1c has been hovering around 7, last reading was 7.3.  He is physically active at work doing a lot of walking, diet is regular. Feet exam negative. We talked about importance of change diet, information provided. Recheck A1c, start metformin?Marland Kitchen HTN: Ambulatory BPs well within normal per patient Angioedema: Seen by the allergy service, Rx Zyrtec, famotidine and montelukast.  EpiPen as needed.  No problems since then. RTC 3 to 4 months CPX

## 2022-07-18 NOTE — Progress Notes (Signed)
   Subjective:    Patient ID: Bryan Shaw, male    DOB: [birth year], 57 y.o.   MRN: [id number]  DOS:  07/18/2022 Type of visit - description: f/u  Since the last office visit is doing well, Saw the allergy service, notes reviewed.  Symptoms of angioedema have not come back. Today I addressed his chronic medical problems: hypertension and diabetes. She also has developed a mild runny nose for the last 2 days without fever, chills or cough  Review of Systems See above   Past Medical History:  Diagnosis Date   Angio-edema    Diabetes mellitus, type 2 ([place])    Essential hypertension 12/31/2019   GERD (gastroesophageal reflux disease)    Positive PPD 1999   INH x 6 months   PPD negative 2001   Urticaria     Past Surgical History:  Procedure Laterality Date   NO PAST SURGERIES      Current Outpatient Medications  Medication Instructions   amLODipine (NORVASC) 5 mg, Oral, Daily   atorvastatin (LIPITOR) 20 MG tablet TAKE 1 TABLET(20 MG) BY MOUTH AT BEDTIME   cetirizine (ZYRTEC) 10 mg, Oral, 2 times daily   diphenhydrAMINE (BENADRYL) 25 mg, Oral, 3 times daily PRN   EPINEPHrine (EPI-PEN) 0.3 mg, Intramuscular, As needed   famotidine (PEPCID) 20 mg, Oral, 2 times daily   hydrochlorothiazide (HYDRODIURIL) 12.5 mg, Oral, Daily   montelukast (SINGULAIR) 10 mg, Oral, Daily at bedtime   omeprazole (PRILOSEC) 20 mg, Oral, Daily       Objective:   Physical Exam BP 126/74   Pulse (!) 54   Temp 97.8 F (36.6 C) (Oral)   Resp 16   Ht '5\' 8"'$  (1.727 m)   Wt 222 lb 4 oz (100.8 kg)   SpO2 98%   BMI 33.79 kg/m  General:   Well developed, NAD, BMI noted. HEENT:  Normocephalic . Face symmetric, atraumatic Lungs:  CTA B Normal respiratory effort, no intercostal retractions, no accessory muscle use. Heart: RRR,  no murmur.  DM foot exam: No edema, good pedal pulses, pinprick examination normal Skin: Not pale. Not jaundice Neurologic:  alert & oriented X3.  Speech normal,  gait appropriate for age and unassisted Psych--  Cognition and judgment appear intact.  Cooperative with normal attention span and concentration.  Behavior appropriate. No anxious or depressed appearing.      Assessment     Assessment DM Dx/2022 HTN (started med 10/2019) High chol GERD Multiple lipomas Angioedema PPD +1999, s/p INH. PPD (-) 2001  PLAN:  DM: A1c has been hovering around 7, last reading was 7.3.  He is physically active at work doing a lot of walking, diet is regular. Feet exam negative. We talked about importance of change diet, information provided. Recheck A1c, start metformin?Marland Kitchen HTN: Ambulatory BPs well within normal per patient Angioedema: Seen by the allergy service, Rx Zyrtec, famotidine and montelukast.  EpiPen as needed.  No problems since then. RTC 3 to 4 months CPX

## 2022-07-19 MED ORDER — METFORMIN HCL 850 MG PO TABS
850.0000 mg | ORAL_TABLET | Freq: Two times a day (BID) | ORAL | 4 refills | Status: DC
Start: 2022-07-19 — End: 2022-10-18

## 2022-07-19 NOTE — Addendum Note (Signed)
Addended byDamita Dunnings D on: 07/19/2022 04:23 PM   Modules accepted: Orders

## 2022-07-20 ENCOUNTER — Encounter: Payer: Self-pay | Admitting: Internal Medicine

## 2022-07-20 ENCOUNTER — Other Ambulatory Visit: Payer: Self-pay | Admitting: Internal Medicine

## 2022-08-28 ENCOUNTER — Ambulatory Visit: Payer: 59 | Admitting: Allergy and Immunology

## 2022-08-31 ENCOUNTER — Other Ambulatory Visit (INDEPENDENT_AMBULATORY_CARE_PROVIDER_SITE_OTHER): Payer: Self-pay | Admitting: Emergency Medicine

## 2022-08-31 DIAGNOSIS — I10 Essential (primary) hypertension: Secondary | ICD-10-CM

## 2022-08-31 DIAGNOSIS — E119 Type 2 diabetes mellitus without complications: Secondary | ICD-10-CM

## 2022-08-31 MED ORDER — METFORMIN HCL 1000 MG OR TABS
1000.00 mg | ORAL_TABLET | Freq: Two times a day (BID) | ORAL | 3 refills | Status: DC
Start: 2022-08-31 — End: 2022-10-05

## 2022-08-31 MED ORDER — LOSARTAN POTASSIUM 50 MG OR TABS
50.00 mg | ORAL_TABLET | Freq: Every day | ORAL | 3 refills | Status: DC
Start: 2022-08-31 — End: 2022-10-05

## 2022-10-05 ENCOUNTER — Other Ambulatory Visit (INDEPENDENT_AMBULATORY_CARE_PROVIDER_SITE_OTHER): Payer: Self-pay | Admitting: Emergency Medicine

## 2022-10-05 DIAGNOSIS — I10 Essential (primary) hypertension: Secondary | ICD-10-CM

## 2022-10-05 DIAGNOSIS — E782 Mixed hyperlipidemia: Secondary | ICD-10-CM

## 2022-10-05 DIAGNOSIS — E119 Type 2 diabetes mellitus without complications: Secondary | ICD-10-CM

## 2022-10-05 MED ORDER — PIOGLITAZONE HCL 30 MG OR TABS
30.00 mg | ORAL_TABLET | Freq: Every day | ORAL | 3 refills | Status: DC
Start: 2022-10-05 — End: 2023-02-14

## 2022-10-05 MED ORDER — METFORMIN HCL 1000 MG OR TABS
1000.00 mg | ORAL_TABLET | Freq: Two times a day (BID) | ORAL | 3 refills | Status: AC
Start: 2022-10-05 — End: ?

## 2022-10-05 MED ORDER — ATORVASTATIN CALCIUM 10 MG OR TABS
10.00 mg | ORAL_TABLET | Freq: Every day | ORAL | 3 refills | Status: DC
Start: 2022-10-05 — End: 2023-02-14

## 2022-10-05 MED ORDER — JANUVIA 100 MG OR TABS
100.00 mg | ORAL_TABLET | Freq: Every day | ORAL | 3 refills | Status: DC
Start: 2022-10-05 — End: 2023-02-14

## 2022-10-05 MED ORDER — LOSARTAN POTASSIUM 50 MG OR TABS
50.00 mg | ORAL_TABLET | Freq: Every day | ORAL | 3 refills | Status: DC
Start: 2022-10-05 — End: 2023-02-14

## 2022-10-08 ENCOUNTER — Encounter: Payer: Self-pay | Admitting: Internal Medicine

## 2022-10-10 ENCOUNTER — Encounter: Payer: Self-pay | Admitting: Gastroenterology

## 2022-10-10 LAB — HM DIABETES EYE EXAM

## 2022-10-16 ENCOUNTER — Other Ambulatory Visit: Payer: Self-pay | Admitting: Internal Medicine

## 2022-10-18 ENCOUNTER — Other Ambulatory Visit: Payer: Self-pay | Admitting: Internal Medicine

## 2022-10-24 ENCOUNTER — Encounter: Payer: Self-pay | Admitting: Internal Medicine

## 2022-10-24 ENCOUNTER — Ambulatory Visit (INDEPENDENT_AMBULATORY_CARE_PROVIDER_SITE_OTHER): Payer: 59 | Admitting: Internal Medicine

## 2022-10-24 VITALS — BP 136/80 | HR 52 | Temp 98.1°F | Resp 16 | Ht 68.0 in | Wt 223.2 lb

## 2022-10-24 DIAGNOSIS — E119 Type 2 diabetes mellitus without complications: Secondary | ICD-10-CM | POA: Diagnosis not present

## 2022-10-24 DIAGNOSIS — Z7984 Long term (current) use of oral hypoglycemic drugs: Secondary | ICD-10-CM | POA: Diagnosis not present

## 2022-10-24 DIAGNOSIS — Z0000 Encounter for general adult medical examination without abnormal findings: Secondary | ICD-10-CM

## 2022-10-24 DIAGNOSIS — I10 Essential (primary) hypertension: Secondary | ICD-10-CM

## 2022-10-24 LAB — HEMOGLOBIN A1C: Hgb A1c MFr Bld: 6.5 % (ref 4.6–6.5)

## 2022-10-24 LAB — LIPID PANEL
Cholesterol: 121 mg/dL (ref 0–200)
HDL: 41.5 mg/dL (ref >39.00)
LDL Cholesterol: 59 mg/dL (ref 0–99)
NonHDL: 79.59
Total CHOL/HDL Ratio: 3
Triglycerides: 101 mg/dL (ref 0.0–149.0)
VLDL: 20.2 mg/dL (ref 0.0–40.0)

## 2022-10-24 LAB — BASIC METABOLIC PANEL
BUN: 17 mg/dL (ref 6–23)
CO2: 31 mEq/L (ref 19–32)
Calcium: 9.9 mg/dL (ref 8.4–10.5)
Chloride: 100 mEq/L (ref 96–112)
Creatinine, Ser: 1.07 mg/dL (ref 0.40–1.50)
GFR: 78.28 mL/min (ref >60.00)
Glucose, Bld: 102 mg/dL — ABNORMAL HIGH (ref 70–99)
Potassium: 4.6 mEq/L (ref 3.5–5.1)
Sodium: 140 mEq/L (ref 135–145)

## 2022-10-24 LAB — ALT: ALT: 26 U/L (ref 0–53)

## 2022-10-24 LAB — PSA: PSA: 0.4 ng/mL (ref 0.10–4.00)

## 2022-10-24 LAB — AST: AST: 21 U/L (ref 0–37)

## 2022-10-24 NOTE — Assessment & Plan Note (Signed)
Here for CPX DM: Last A1c 7.5, started metformin 07-2022, good compliance, no apparent side effects.  Checking labs. Encourage daily exercise, portion control particularly with carbohydrates. Encourage self learning, see AVS HTN: BP today satisfactory, continue amlodipine, HCTZ.  Labs. High cholesterol: On atorvastatin, labs. Angioedema: Workup per allergist was neg, currently asx, taking medications as needed only. RTC 6 months, sooner depending on results

## 2022-10-24 NOTE — Progress Notes (Signed)
Subjective:    Patient ID: Bryan Shaw, male    DOB: [birth year], 57 y.o.   MRN: [id number]  DOS:  10/24/2022 Type of visit - description: cpx  Here for CPX. No major concerns. Started metformin without apparent side effects  Review of Systems  Other than above, a 14 point review of systems is negative     Past Medical History:  Diagnosis Date   Angio-edema    Diabetes mellitus, type 2    Essential hypertension 12/31/2019   GERD (gastroesophageal reflux disease)    Positive PPD 1999   INH x 6 months   PPD negative 2001   Urticaria     Past Surgical History:  Procedure Laterality Date   NO PAST SURGERIES     Social History   Socioeconomic History   Marital status: Married    Spouse name: Not on file   Number of children: 1   Years of education: Not on file   Highest education level: Not on file  Occupational History   Occupation: Control and instrumentation engineer  [employer]  Tobacco Use   Smoking status: Never   Smokeless tobacco: Never  Substance and Sexual Activity   Alcohol use: No   Drug use: Yes    Types: Amphetamines   Sexual activity: Yes    Birth control/protection: None  Other Topics Concern   Not on file  Social History Narrative   From [place], wife from [place]    1 adopted son, adult   Household: pt and wife    Social Determinants of Corporate investment banker Strain: Not on file  Food Insecurity: Not on file  Transportation Needs: Not on file  Physical Activity: Not on file  Stress: Not on file  Social Connections: Not on file  Intimate Partner Violence: Not on file    Current Outpatient Medications  Medication Instructions   amLODipine (NORVASC) 5 mg, Oral, Daily   atorvastatin (LIPITOR) 20 mg, Oral, Daily at bedtime   cetirizine (ZYRTEC) 10 mg, Oral, 2 times daily   diphenhydrAMINE (BENADRYL) 25 mg, 3 times daily PRN   EPINEPHrine (EPI-PEN) 0.3 mg, Intramuscular, As needed   famotidine (PEPCID) 20 mg, Oral, 2 times daily    hydrochlorothiazide (HYDRODIURIL) 12.5 mg, Oral, Daily   metFORMIN (GLUCOPHAGE) 850 mg, Oral, 2 times daily with meals   montelukast (SINGULAIR) 10 mg, Oral, Daily at bedtime   omeprazole (PRILOSEC) 20 mg, Oral, Daily       Objective:   Physical Exam BP 136/80   Pulse (!) 52   Temp 98.1 F (36.7 C) (Oral)   Resp 16   Ht  (1.727 m)   Wt 223 lb 4 oz (101.3 kg)   SpO2 96%   BMI 33.95 kg/m  General: Well developed, NAD, BMI noted Neck: No  thyromegaly  HEENT:  Normocephalic . Face symmetric, atraumatic Lungs:  CTA B Normal respiratory effort, no intercostal retractions, no accessory muscle use. Heart: RRR,  no murmur.  Abdomen:  Not distended, soft, non-tender. No rebound or rigidity.   Lower extremities: no pretibial edema bilaterally  Skin: Exposed areas without rash. Not pale. Not jaundice Neurologic:  alert & oriented X3.  Speech normal, gait appropriate for age and unassisted Strength symmetric and appropriate for age.  Psych: Cognition and judgment appear intact.  Cooperative with normal attention span and concentration.  Behavior appropriate. No anxious or depressed appearing.     Assessment    Assessment DM Dx/2022 HTN (started med 10/2019) High chol  GERD Multiple lipomas Angioedema (allergist eval 2023, blood test neg, sxs self resolved, no clear etiology, on meds prn) PPD +1999, s/p INH. PPD (-) 2001  PLAN:  Here for CPX DM: Last A1c 7.5, started metformin 07-2022, good compliance, no apparent side effects.  Checking labs. Encourage daily exercise, portion control particularly with carbohydrates. Encourage self learning, see AVS HTN: BP today satisfactory, continue amlodipine, HCTZ.  Labs. High cholesterol: On atorvastatin, labs. Angioedema: Workup per allergist was neg, currently asx, taking medications as needed only. RTC 6 months, sooner depending on results

## 2022-10-24 NOTE — Assessment & Plan Note (Signed)
-   Tdap 2019 - Covid vax:  booster is an option - s/p shingrix x2 -CCS: Colonoscopy 5/ 2019,  due for repeat colonoscopy, GI letter reprinted for the patient -prostate ca: No symptoms,   checking PSA -Diet-exercise: Encouraged to exercise more, diet discussed. - Labs: BMP AST ALT FLP A1c PSA - ACP info provided

## 2022-10-24 NOTE — Patient Instructions (Addendum)
Vaccines I recommend:  Covid booster if not done after September 2023. Flu shot every fall  Check the  blood pressure regularly BP GOAL is between 110/65 and  135/85. If it is consistently higher or lower, let me know   To learn more about diabetes recommend to visit: American diabetes Association website [facility] website  GO TO THE LAB : Get the blood work     GO TO THE FRONT DESK, PLEASE SCHEDULE YOUR APPOINTMENTS Come back for a checkup in 6 months      "Health Care Power of attorney" ,  "Living will" (Advance care planning documents)  If you already have a living will or healthcare power of attorney, is recommended you bring the copy to be scanned in your chart.   The document will be available to all the doctors you see in the system.  Advance care planning is a process that supports adults in  understanding and sharing their preferences regarding future medical care.  The patient's preferences are recorded in documents called Advance Directives and the can be modified at any time while the patient is in full mental capacity.   If you don't have one, please consider create one.      More information at: [email or web address]

## 2022-11-08 ENCOUNTER — Encounter: Payer: Self-pay | Admitting: Gastroenterology

## 2022-11-17 ENCOUNTER — Other Ambulatory Visit: Payer: Self-pay | Admitting: Allergy and Immunology

## 2023-01-16 ENCOUNTER — Telehealth: Payer: Self-pay | Admitting: *Deleted

## 2023-01-16 NOTE — Telephone Encounter (Signed)
Patient called to follow up on the phone call he was scheduled for at 4pm said he received a Mychart message saying he was contacted but the patient stated he did not and also does not have any voicemail's. I confirmed the number on file with the patient and he confirmed it to be correct. He will be leaving out of the country for about a month but the patient was able to reschedule.

## 2023-01-16 NOTE — Telephone Encounter (Signed)
Attempt to reach pt for pre-visit. LM with call back #. Wno other # but worklisted. Will try in 5 minutes  .  Second attempt to reach pt for pre-vist unsuccessful. LM with facility # for pt to call back. Instructed pt to call # given by end of the day and reschedule the pre-visit or the scheduled procedure will be canceled.

## 2023-02-14 ENCOUNTER — Ambulatory Visit (INDEPENDENT_AMBULATORY_CARE_PROVIDER_SITE_OTHER): Admitting: Emergency Medicine

## 2023-02-14 ENCOUNTER — Encounter (INDEPENDENT_AMBULATORY_CARE_PROVIDER_SITE_OTHER): Payer: Self-pay | Admitting: Emergency Medicine

## 2023-02-14 VITALS — BP 134/86 | HR 70 | Temp 97.3°F | Resp 16 | Ht 68.0 in | Wt 186.0 lb

## 2023-02-14 MED ORDER — PIOGLITAZONE HCL 30 MG OR TABS
30.0000 mg | ORAL_TABLET | Freq: Every day | ORAL | 3 refills | Status: DC
Start: 2023-02-14 — End: 2023-08-13

## 2023-02-14 MED ORDER — OMEGA-3-ACID ETHYL ESTERS 1 GM OR CAPS
ORAL_CAPSULE | ORAL | Status: AC
Start: 2022-09-04 — End: ?

## 2023-02-14 MED ORDER — TRUE METRIX BLOOD GLUCOSE TEST VI STRP
1.0000 | ORAL_STRIP | Freq: Two times a day (BID) | 3 refills | Status: DC
Start: 2023-02-14 — End: 2023-02-17

## 2023-02-14 MED ORDER — SHINGRIX 50 MCG/0.5ML IM SUSR
0.5000 mL | Freq: Once | INTRAMUSCULAR | 0 refills | Status: AC
Start: 2023-02-14 — End: 2023-02-14

## 2023-02-14 MED ORDER — PNEUMOCOCCAL VAC POLYVALENT 25 MCG/0.5ML IJ INJ (CUSTOM)
0.5000 mL | INJECTION | Freq: Once | INTRAMUSCULAR | 0 refills | Status: AC
Start: 2023-02-14 — End: 2023-02-14

## 2023-02-14 MED ORDER — LOSARTAN POTASSIUM 50 MG OR TABS
50.0000 mg | ORAL_TABLET | Freq: Every day | ORAL | 3 refills | Status: DC
Start: 2023-02-14 — End: 2023-08-13

## 2023-02-14 MED ORDER — JANUVIA 100 MG OR TABS
100.0000 mg | ORAL_TABLET | Freq: Every day | ORAL | 3 refills | Status: DC
Start: 2023-02-14 — End: 2023-08-13

## 2023-02-14 NOTE — Progress Notes (Unsigned)
Subjective :  Matthew Estes is a 57 year old male who is here for Physical (57 yo,male come to the clinic for physical exam,by: E Jayme Cloud MA.)      Matthew Estes is a 57 YO M who presents for routine physical examination. Pt states he feels well and has no CC at this time. Pt states he stopped DM II treatment a few weeks ago.     Colonoscopy ordered     DM retinal exam never done, ordered this visit  DM foot never done, ordered this visit    Does not have a dental home, information provided for pt to establish care.     Did not receive flu vaccine 2023, will get 2024      Issues discussed today:  Routine physical examination  DM II  HTN    Review of Systems    Constitutional: Negative for activity change, appetite change, fatigue, fever and unexpected weight change.   HENT: Negative for congestion, facial swelling, hearing loss, rhinorrhea, sinus pressure, sore throat, trouble swallowing and voice change.    Eyes: Negative for photophobia, pain, discharge, redness, itching and visual disturbance.   Respiratory: Negative for apnea, cough, choking, chest tightness, shortness of breath, wheezing and stridor.    Cardiovascular: Negative for chest pain, palpitations and leg swelling.   Gastrointestinal: Negative for abdominal distention, abdominal pain, anal bleeding, blood in stool, constipation, diarrhea, nausea, rectal pain and vomiting.   Endocrine: Negative for cold intolerance, heat intolerance, polydipsia, polyphagia and polyuria.   Genitourinary: Negative for difficulty urinating, dysuria, enuresis, flank pain, frequency, genital sores and hematuria.   Musculoskeletal: Negative for arthralgias, back pain, gait problem, joint swelling, myalgias, neck pain and neck stiffness.   Skin: Negative for pallor, rash and wound.   Allergic/Immunologic: Negative for environmental allergies, food allergies and immunocompromised state.   Neurological: Negative for dizziness, tremors, seizures, syncope, facial asymmetry, speech  difficulty, weakness, light-headedness, numbness and headaches.   Hematological: Negative for adenopathy. Does not bruise/bleed easily.   Psychiatric/Behavioral: Negative for agitation, behavioral problems, confusion, decreased concentration, dysphoric mood, hallucinations, self-injury, sleep disturbance and suicidal ideas. The patient is not nervous/anxious and is not hyperactive.       Current Outpatient Medications   Medication Sig Dispense Refill    atorvastatin (LIPITOR) 40 MG tablet Take 1 tablet (40 mg) by mouth daily for 30 days. 30 tablet 5    glucose blood (TRUE METRIX BLOOD GLUCOSE TEST) test strip 1 strip by Other route 2 times daily (before meals). 200 strip 3    herpes zoster recombinant adjuvanted vaccine (SHINGRIX) 50 MCG/0.5ML IM injection 0.5 mL by IntraMUSCULAR route once for 1 dose. 1 each 0    losartan (COZAAR) 50 MG tablet Take 1 tablet (50 mg) by mouth daily. 90 tablet 3    metFORMIN (GLUCOPHAGE) 1000 mg tablet TAKE 1 TABLET BY MOUTH TWICE A DAY WITH MEALS 180 tablet 3    omega-3 acid ethyl esters (LOVAZA) 1 GM capsule TAKE TWO CAPSULE BY MOUTH TWICE DAILY FOR CHOLESTEROL      pioglitazone (ACTOS) 30 mg tablet Take 1 tablet (30 mg) by mouth daily. 90 tablet 3    pneumococcal 23-valent vaccine (PNEUMOVAX-23) 25 MCG/0.5ML injection 0.5 mL by IntraMUSCULAR route once for 1 dose. 0.5 mL 0    SITagliptin (JANUVIA) 100 mg tablet Take 1 tablet (100 mg) by mouth daily. 90 tablet 3     No current facility-administered medications for this visit.     No Known Allergies  Reviewed patients pertinent information related to social history, past medical, past surgical, and family history.     Objective :  Vital signs: BP 134/86 (BP Location: Left arm, BP Patient Position: Sitting, BP cuff size: Regular)   Pulse 70   Temp 97.3 F (36.3 C) (Temporal)   Resp 16   Ht 5\' 8"  (1.727 m)   Wt 84.4 kg (186 lb)   SpO2 97%   BMI 28.28 kg/m     Physical Exam   Constitutional:       General: not in acute  distress.     Appearance: Normal appearance. He is normal weight. He is not ill-appearing, toxic-appearing or diaphoretic.   HENT:      Head: Normocephalic and atraumatic. Left ear canal clear, TM normal. Right ear canal clear, TM normal. Oral mucosa pink, pharynx normal.   Eyes:      General: No scleral icterus.        Right eye: No discharge.         Left eye: No discharge.   Neck:      Vascular: No carotid bruit.   Cardiovascular:      Rate and Rhythm: Normal rate and regular rhythm.      Pulses: Normal pulses.      Heart sounds: Normal heart sounds. No murmur heard.    No friction rub. No gallop.   Pulmonary:      Effort: Pulmonary effort is normal. No respiratory distress.      Breath sounds: Normal breath sounds. No stridor. No wheezing, rhonchi or rales.   Chest:      Chest wall: No tenderness.   Abdominal:      General: Abdomen is flat. Bowel sounds are normal. There is no distension.      Palpations: Abdomen is soft. There is no mass.      Tenderness: There is no abdominal tenderness. There is no right CVA tenderness, left CVA tenderness, guarding or rebound.      Hernia: No hernia is present.   Musculoskeletal:         General: No swelling, tenderness, deformity or signs of injury.      Cervical back: No rigidity or tenderness.      Right lower leg: No edema.      Left lower leg: No edema.   Lymphadenopathy:      Cervical: No cervical adenopathy.   Skin:     Capillary Refill: Capillary refill 2 seconds     Coloration: Skin is not jaundiced or pale.      Findings: No bruising, erythema, lesion or rash.   Neurological:      General: No focal deficit present.      Mental Status: is alert. Mental status is at baseline.      Cranial Nerves: No cranial nerve deficit.      Sensory: No sensory deficit.      Motor: No weakness.      Coordination: Coordination normal.      Gait: Gait normal.      Deep Tendon Reflexes: Reflexes normal.   Psychiatric:         Mood and Affect: Mood normal.         Behavior: Behavior  normal.         Thought Content: Thought content normal.         Judgment: Judgment normal.     Advanced directive information provided     USPSTF risk screenings  Adverse childhood experiences risk  assessed utilizing PEARLS screening tool and pt at no risk.   Alcohol misuse assessed utilizing the [facility] and AUDIT screening tools and pt at no risk.  Colorectal cancer risk assessed during H&P and pt at no risk.  Depression screening performed using the PHQ-9 screening tool and pt at no risk.  Diabetes risk assessed during H&P and pt at no risk.  Drug misuse risk assessed utilizing the DAST screening tool and pt at no risk.  Dyslipidemia risk assessment performed during H&P and pt at no risk.  Hep B risk assessed during H&P and pt at no risk.  Hep C risk assessed during H&P and pt at no risk.  HIV risk assessed utilizing the [facility] screening tool and pt at no risk.   Lung cancer risk assessed during H&P and pt at no risk.  Obesity risk assessed during H&P and pt at no risk.  Sexual transmitted infections assessed using the [facility] screening tool and pt at no risk.  Social determinants of health assessed utilizing the SDOH screening tool and pt at no risk.  Suicide risk assessed utilizing the ASQ Suicide Risk Screening Tool and pt at no risk.   Tobacco use/exposure risk assessed utilizing SHA screening tool and pt at no risk.  Tuberculosis exposure risk assessed utilizing the TB Risk Assessment screening tool and pt at no risk.     Assessment/Plan:  Tom was seen today for physical.    Diagnoses and all orders for this visit:    Routine physical examination  -     Lipid Panel  -     Glycosylated Hgb(A1C), Blood  -     TSH, Blood  -     Comprehensive Metabolic Panel; Future  -     CBC w/ Diff  -     Urinalysis with Culture Reflex, when indicated; Future  -     Vitamin D, 25-OH Total Yellow serum separator tube  -     Comprehensive Metabolic Panel  -     Urinalysis with Culture Reflex, when indicated    Diabetes mellitus with no  complication (CMS-HCC)  -     Consult/Referral to Ophthalmology  -     Consult/Referral to [facility]  -     Random Urine Microalb/Creat Ratio Panel; Future  -     Lipid Panel  -     Glycosylated Hgb(A1C), Blood  -     TSH, Blood  -     Comprehensive Metabolic Panel; Future  -     CBC w/ Diff  -     Urinalysis with Culture Reflex, when indicated; Future  -     Vitamin D, 25-OH Total Yellow serum separator tube  -     SITagliptin (JANUVIA) 100 mg tablet; Take 1 tablet (100 mg) by mouth daily.  -     pioglitazone (ACTOS) 30 mg tablet; Take 1 tablet (30 mg) by mouth daily.  -     glucose blood (TRUE METRIX BLOOD GLUCOSE TEST) test strip; 1 strip by Other route 2 times daily (before meals).  -     Random Urine Microalb/Creat Ratio Panel  -     Comprehensive Metabolic Panel  -     Urinalysis with Culture Reflex, when indicated    BMI 28.0-28.9,adult  -     Lipid Panel  -     Glycosylated Hgb(A1C), Blood  -     TSH, Blood  -     Comprehensive Metabolic Panel;  Future  -     CBC w/ Diff  -     Urinalysis with Culture Reflex, when indicated; Future  -     Vitamin D, 25-OH Total Yellow serum separator tube  -     Comprehensive Metabolic Panel  -     Urinalysis with Culture Reflex, when indicated    Essential hypertension  -     Lipid Panel  -     Glycosylated Hgb(A1C), Blood  -     TSH, Blood  -     Comprehensive Metabolic Panel; Future  -     CBC w/ Diff  -     Urinalysis with Culture Reflex, when indicated; Future  -     Vitamin D, 25-OH Total Yellow serum separator tube  -     losartan (COZAAR) 50 MG tablet; Take 1 tablet (50 mg) by mouth daily.  -     Comprehensive Metabolic Panel  -     Urinalysis with Culture Reflex, when indicated    Mixed hyperlipidemia  -     Lipid Panel  -     Glycosylated Hgb(A1C), Blood  -     TSH, Blood  -     Comprehensive Metabolic Panel; Future  -     CBC w/ Diff  -     Urinalysis with Culture Reflex, when indicated; Future  -     Vitamin D, 25-OH Total Yellow serum  separator tube  -     Comprehensive Metabolic Panel  -     Urinalysis with Culture Reflex, when indicated    Colon cancer screening  -     Consult/Referral to GI for Colonoscopy Screening    Need for vaccination  -     herpes zoster recombinant adjuvanted vaccine (SHINGRIX) 50 MCG/0.5ML IM injection; 0.5 mL by IntraMUSCULAR route once for 1 dose.  -     pneumococcal 23-valent vaccine (PNEUMOVAX-23) 25 MCG/0.5ML injection; 0.5 mL by IntraMUSCULAR route once for 1 dose.    Dietary counseling    Exercise counseling    Depression screening negative    Encounter for hepatitis C screening test for low risk patient  -     Hepatitis C Antibody    Matthew Estes was seen today for physical.    Diagnoses and all orders for this visit:    Routine physical examination  -     Lipid Panel  -     Glycosylated Hgb(A1C), Blood  -     TSH, Blood  -     Comprehensive Metabolic Panel; Future  -     CBC w/ Diff  -     Urinalysis with Culture Reflex, when indicated; Future  -     Vitamin D, 25-OH Total Yellow serum separator tube  -     Comprehensive Metabolic Panel  -     Urinalysis with Culture Reflex, when indicated  Pt educated on assessment findings, diagnosis and plan of care. Pt verbalized understanding.     Diabetes mellitus with no complication (CMS-HCC)  -     Consult/Referral to Ophthalmology  -     Consult/Referral to [facility]  -     Random Urine Microalb/Creat Ratio Panel; Future  -     Lipid Panel  -     Glycosylated Hgb(A1C), Blood  -     TSH, Blood  -     Comprehensive Metabolic Panel; Future  -     CBC w/ Diff  -  Urinalysis with Culture Reflex, when indicated; Future  -     Vitamin D, 25-OH Total Yellow serum separator tube  -     SITagliptin (JANUVIA) 100 mg tablet; Take 1 tablet (100 mg) by mouth daily.  -     pioglitazone (ACTOS) 30 mg tablet; Take 1 tablet (30 mg) by mouth daily.  -     glucose blood (TRUE METRIX BLOOD GLUCOSE TEST) test strip; 1 strip by Other route 2 times daily (before meals).  -      Random Urine Microalb/Creat Ratio Panel  -     Comprehensive Metabolic Panel  -     Urinalysis with Culture Reflex, when indicated  Restarted on DM II treatment. ADA diet, exercise recommendations, and ER precautions reviewed.    BMI 28.0-28.9,adult  -     Lipid Panel  -     Glycosylated Hgb(A1C), Blood  -     TSH, Blood  -     Comprehensive Metabolic Panel; Future  -     CBC w/ Diff  -     Urinalysis with Culture Reflex, when indicated; Future  -     Vitamin D, 25-OH Total Yellow serum separator tube  -     Comprehensive Metabolic Panel  -     Urinalysis with Culture Reflex, when indicated  Diet and exercise recommendations for healthy weight for age reviewed.     Essential hypertension  -     Lipid Panel  -     Glycosylated Hgb(A1C), Blood  -     TSH, Blood  -     Comprehensive Metabolic Panel; Future  -     CBC w/ Diff  -     Urinalysis with Culture Reflex, when indicated; Future  -     Vitamin D, 25-OH Total Yellow serum separator tube  -     losartan (COZAAR) 50 MG tablet; Take 1 tablet (50 mg) by mouth daily.  -     Comprehensive Metabolic Panel  -     Urinalysis with Culture Reflex, when indicated  Stable on current treatment, monitor BP at home, keep diary log and bring back next visit. DASH diet, exercise recommendations and ER precautions reviewed.    Mixed hyperlipidemia  -     Lipid Panel  -     Glycosylated Hgb(A1C), Blood  -     TSH, Blood  -     Comprehensive Metabolic Panel; Future  -     CBC w/ Diff  -     Urinalysis with Culture Reflex, when indicated; Future  -     Vitamin D, 25-OH Total Yellow serum separator tube  -     Comprehensive Metabolic Panel  -     Urinalysis with Culture Reflex, when indicated  Low fat, high fiber diet discussed. Stable on statin therapy. Pt educated on medication and potential side effects. ER precautions reviewed.    Colon cancer screening  -     Consult/Referral to GI for Colonoscopy Screening  Stable    Need for vaccination  -     herpes zoster recombinant  adjuvanted vaccine (SHINGRIX) 50 MCG/0.5ML IM injection; 0.5 mL by IntraMUSCULAR route once for 1 dose.  -     pneumococcal 23-valent vaccine (PNEUMOVAX-23) 25 MCG/0.5ML injection; 0.5 mL by IntraMUSCULAR route once for 1 dose.  Stable    Dietary counseling  Dietary recommendations for age and weight reviewed with pt.    Exercise counseling  Exercise recommendations for  age and weight reviewed with pt.     Depression screening negative  Stable    Encounter for hepatitis C screening test for low risk patient  -     Hepatitis C Antibody  Stable    Anticipatory guidance    Diet, Nutrition & Exercise   Weight control / obesity. Vegetables, fruits. Lean protein. Whole grains / iron-rich foods. Limit fatty, sugary & salty foods. Limit candy, chips & ice cream. Physical activity / exercise. Healthy food choices. Eating disorder.    Accident Prevention & Guidance   Alcohol/drug/substance misuse counseling. Avoid risk-taking behavior. Independence. Signs of depression (suicidal ideation). Gun safety. Personal development. Diabetes management. Violent behavior. Goals in life. Sex education (partner selection). Mindful of daily movements. Work or retirement activities. Safe sex practices (condoms, contraception, HIV/AIDS). Motor vehicle safety (DUI / no texting & driving). Family support, social interaction & communication. Smoking/vaping use/exposure. Seat belt. Testicular self-exam. Routine dental care. Safety helmet. Aging process.     HEALTH PROBLEM 19 - 40 YEARS 40 - 65 YEARS 63 YERS AND OLDER   Abuse & Neglect Identify and evaluate unexplained physical and/or behavioral signs and symptoms at each visit. These signs might include some of the following: unexplained bruising,  falls, injuries, weight loss, depression and behavior changes.   Alcohol & Substance  Abuse Screen yearly.   Breast Cancer (Women)  Clinical Breast Exam  Mammography  BRCA Perform yearly breast exam every two to three  years. 4  Routine screening with  clinical breast exam not  recommended. 22  Routine screening with mammography not  recommended. 22 For ages 40?65 screen with clinical breast exam  yearly. 4  Start screening at age 68 with yearly clinical breast  exam.22  Order screening mammography yearly.4  Women ages 40?49 routine mammogram not  recommended. 22 Perform clinical breast exam every one to two years  if life expectancy is greater than or equal to five  years. 21  Order screening mammography every two years  until age 59. At age 88, consider stopping routine  screening if patient has significant medical problems  that threaten life expectancy. 22  Order screening mammography every one to two  years if life expectancy is greater than or equal to  five years. 21    For women with a family history associated with increased risk for BRCA1 or BRCA2 gene mutations,  consider referral for genetic counseling and evaluation. 20 --    ?? Comments  ?Family history is often difficult to obtain in this population. Individual decision?making is critical. Inform women and caregivers of potential benefits and consequences of breast cancer screening.  ?Consider clinical breast exam as women with intellectual disabilities may not understand the significance of changes or have the skills to communicate changes they  notice. Also, women with sensory or neuromuscular problems may have difficulty performing an accurate self?exam.  ?Consider genetic testing for the BRCA?A gene for women who have a strong family history and are unable to do a mammogram. 20   Cervical Spine Atlanto?  Axial Instability Perform an annual neurologic examination for signs and symptoms of spinal cord injury for patients with Down syndrome. 1  Order Cspine x?ray with lateral flexion and extension if symptoms develop, such as: changes in behavior or activity, changes in hand preference or urinary  incontinence. If this is the first Cspine, also order an anteroposterior view.  Comments  ?Consider  screening cervical spine films prior to participation in athletics.   Cervical Cancer  (  Women)       Individualized decision?making depending on patient risk and sexual history. No consensus, but major groups recommend the following:    Before age 18, avoid pap smear screening  regardless of sexual activity. At age 20, begin pap  smear screening. Ages 21?29 screen every two  years, then every three years for women with  three consecutive normal paps. 3  Perform pap smear within three years after first  sexual intercourse or by age 2, whichever comes  first. Pap every one to three years. 22,19 Perform pap smear screening every three years for  women with three consecutive normal paps. 3  Perform pap smear screening every one to three  years. 22,19 At age 30 22, or age 47 4, consider discontinuing pap  smear screening if the patient has had three or more  documented, consecutive normal tests.    Comments  ?See Breast Health Access for Women with Disabilities (BHAWD): "Table Manners and Beyond: The Gynecological Exam for Women with Developmental Disabilities and  Other Functional Limitations": https://mays-johnson.net/  ?See "Tips for a Successful Pelvic Exam": http://familymedicine.medschool.https://www.green.com/   Chlamydia Screen annually for sexually active women  through age 83. 40 For patients age 53 and older, since there is no data, individualized decision?making is appropriate.    Comments  ?Patients may not reliably report sexual activity or symptoms.   Dental Disease Perform an annual oral exam.  Refer to dentist for regular dental care including cleaning every six months or as recommended by the dentist.  Pay special attention to dental and gum health in persons with certain syndromes, such as Cornelia de Lange, Cerebral Palsy, Down, Prader?Guadelupe Sabin, Rett, Williams  and Tuberous Sclerosis.16  Comments  ?Patients with developmental  disabilities are at high risk for periodontal disease for numerous reasons, including: difficulty maintaining hygiene, lack of access to regular dental care, syndrome?specific susceptibilities and medications.  ?In some patients unable to tolerate office exams and treatment, hospital dentistry under anesthesia may be indicated. Other necessary diagnostic testing should be  considered while patient is sedated.   Depression Screen annually or sooner for behaviors or emotions that may indicate depression. 16  Comments  ?Patients with developmental disabilities may have difficulty recognizing and communicating symptoms such as depressed mood, anxiety, and sadness. Mental health  symptoms are often expressed in physical or behavioral changes. It is critical that health care providers obtain information about the patient's usual level of functioning,  skills and behavior in order to assess the potential for mental health disorders.  ?See Diagnostic Manual?Intellectual Disabilities for more in?depth discussion on assessment.11   Diabetes Screen at least every three years until age 67. Screen annually after age 56 and annually for patients on antipsychotic medication and those with syndromes  associated with diabetes, such as Prader?Johnnette Gourd, Klinefelter, Turner and Down. 16  Comments  ?Individualized decision?making about screening is appropriate for other individuals with developmental disabilities.  ?See American Diabetes Association. Consensus Statement on Antipsychotic Drugs and Obesity and Diabetes:  http://care.diabetesjournals.org/content/27/2/596.full?ijkey=[email or web address]&keytype2=tf_ipsecsha   Fall Risk For all ages: evaluate as part of the annual physical examination including an evaluation of the medication profile for drugs that may impact balance and/or gait.  Screen more frequently if there is a change in gait/balance or for individuals at high risk, such as those who have a history of  two or more falls in the previous year. 16  For patients with no previous mobility impairments who report one or more falls, consider performing the "Get?Up and Go  Test":  https://santos.com/. Patients having difficulty with this test should be referred to a physical/occupational therapist for a full fall  evaluation.  If the patient has had an increase in falls or a decline in function, a medical evaluation of the cause is warranted.   HIV For ages 13?64 screen with at least one HIV test in their lifetime.  Test periodically for patients at?risk (sexually active without barrier protection, multiple partners, men who  have sex with men, all pregnant women, history of sexually transmitted diseases). 6,22 --   Hearing Screen annually subjectively or objectively with office?based testing Personnel officer).    --- Refer to audiology at regular intervals.  Refer to patients for hearing assessment every five years after age 19 (every three years throughout life for  patients with Down syndrome).  Reevaluate hearing if problems are reported or changes in behavior are noted. 17    Comments  ?Other syndromes associated with hearing impairments include Cornelia de Caryl Comes, Noonan, Usher, and Katrinka Blazing?Magenis. 16  ?Methods for testing may include the following:    Method Applicable for  Developmental Age (years)   OtoAcoustic Emissions (OAE) > 0   Auditory Brainstem Responses (ABR) > 0   Behavioural observation audiometry > 0   Pure tone audiometry with visual reinforcement > 1   Whispered speech > 3   Pure tone (play) audiometry > 3?4      Hypertension Measure blood pressure annually. 22  Comments  ?For patients with spasticity/contractures, may need to do a wrist or thigh blood pressure measurement. Document type of measurement used.   Immunizations See Centers for Disease Control Recommended Adult Immunization Schedule: https://www.white.net/?Immunization.pdf.   Obesity Measure height and  weight annually. 22  Comments  ?Consider weight on home scale in more familiar setting.  ?Accommodations for patients unable to stand include using a Marine scientist, a wheelchair scale, [employer], and/or hospital bed which includes a scale.   Osteoporosis Consider bone mineral density (BMD) screening earlier and at regular intervals for high?risk patients.  Check serum vitamin D 25 OH levels at regular intervals.    --- Although the age to begin screening is unclear, some authors suggest age 70 for patients residing in  institutions and age 61 for patients residing in the community.  Multiple sources recommend BMD screening beginning for the general population at age 36 every three to  five years if normal baseline test; at age 71 every one to two years if high risk. (AAFP, USPSTF, AACE). 10    Comments  ?High risk factors in patients with developmental disabilities include: mobility impairments, long term use of antiepileptic drugs or antipsychotics, Down syndrome,  Cerebral Palsy, and Prader?Willi syndrome.  ?High risk factors in the general population include: osteopenia on plain films, history of vertebral fractures, early menopause, chronic steroid use, low body weight,  cigarette use and positive family history of osteoporosis.  ?See FRAX: WHO Fracture Risk Assessment Tool: www.shef.ac.uk/FRAX/. Note that mobility is not calculated in this assessment tool.   Prostate Cancer (Men) --- Insufficient evidence to recommend routine  screening in men under age 54. 56 Screening not recommended for men over age 83. 2    Comments  ?Family history is often difficult to obtain with this population.  ?Patients at high?risk include positive family history at an early age and African American men.   Testicular Cancer (Men) Routine screening not recommended.  Prompt assessment and evaluation of testicular  problems when young men present with signs and  symptoms of  testicular disease. 20 ---    Comments  ?Clinical exam is  especially important in this population who may not be able to report symptoms and may have difficulty with the self exam technique.   Thyroid Disease Perform thyroid stimulating hormone (TSH) test every three years. 16  Thyroid function tests should be performed annually for patients with Down syndrome. 17 Perform TSH test annually.    Comments  ?Symptoms of thyroid disease are often not elicited due to cognitive impairment and/or communication  difficulties in patients with developmental disabilities.  ?Consider TSH testing if unexplained change in behavior or level of functioning.  ?Increased risk for thyroid disease seen in patients with Down syndrome and the elderly.   Tuberculosis Screen routinely based on likelihood of exposure. (CDC 2005, AAFP 2008).  Comments  ?Consider PPD skin testing every one to two years for patients who live or work in [employer] settings (board and care homes, intermediate care facilities, day programs).   Vision Screen annually subjectively or objectively with office?based tests.    Refer to ophthalmology for exam and glaucoma  screening at least once before age 42 and by age  33 for patients with Down syndrome. 17 Refer for ophthalmologic exam and glaucoma screening every two to three years or as recommended by  ophthalmologist.    Comments  ?Screen more frequently for persons with diabetes, those on long?term psychiatric medication, and those with syndromes associated with vision deficits/ocular  abnormalities, such as Cornelia de Caryl Comes, Antonietta Breach, Down, Katrinka Blazing?Magenis, Tuberous Sclerosis, and Velocardiofacial. 18   COUNSELING 19 - 40 YEARS 40 - 65 YEARS 65 YEARS AND OLDER   Lifestyle Modification/  Healthy Quality of Life Discuss:  Adequate calcium and vitamin D supplementation.  Advanced directive.  Dental hygiene.  Fall risk assessment and prevention.  Nutrition and physical activity.  Tobacco and substance abuse cessation.  Sexual health, including: contraception, sexually  transmitted disease prevention, and healthy relationships.  Comments  ?Include caregivers, health advocates, and parents/family members to help reinforce teaching concepts.   Medication Review Review medications at regular intervals with patients and caregivers to assure adherence with regimen and evaluate for side effects and drug interactions.  Comments  ?High rates of polypharmacy exist.   Safety Review safety practices per individual circumstance, such as stranger and street safety for patients who live independently; prevention of head trauma in patients with  frequent seizures; and street safety for patients with unpredictable behavior.     Nutrition and healthy food. Exercise. low fat diet. high fiber diet. Take medications as directed. Emergency room instructions were given.  Follow up in 14 days.

## 2023-02-15 ENCOUNTER — Other Ambulatory Visit: Payer: Self-pay | Admitting: Internal Medicine

## 2023-02-15 LAB — CBC WITH DIFF, BLOOD
Abs Basophils: 38 cells/uL (ref 0–200)
Abs Eosinophils: 81 cells/uL (ref 15–500)
Abs Lymphs: 1528 cells/uL (ref 850–3900)
Abs Monocytes: 362 cells/uL (ref 200–950)
Abs Neutrophils: 3391 cells/uL (ref 1500–7800)
Basophils: 0.7 %
Eosinophils: 1.5 %
HCT: 40.9 % (ref 38.5–50.0)
HGB: 13.5 g/dL (ref 13.2–17.1)
Lymps: 28.3 %
MCH: 29.3 pg (ref 27.0–33.0)
MCHC: 33 g/dL (ref 32.0–36.0)
MCV: 88.9 fL (ref 80.0–100.0)
MPV: 11.3 fL (ref 7.5–12.5)
Monocytes: 6.7 %
PLT: 235 10*3/uL (ref 140–400)
RBC: 4.6 10*6/uL (ref 4.20–5.80)
RDW: 13.3 % (ref 11.0–15.0)
SEGS: 62.8 %
WBC: 5.4 10*3/uL (ref 3.8–10.8)

## 2023-02-15 LAB — COMPREHENSIVE METABOLIC PANEL, BLOOD
ALT (SGPT): 20 U/L (ref 9–46)
AST (SGOT): 19 U/L (ref 10–35)
Albumin/Glob Ratio: 1.8 (calc) (ref 1.0–2.5)
Albumin: 4.4 g/dL (ref 3.6–5.1)
Alkaline Phos: 82 U/L (ref 35–144)
BUN: 18 mg/dL (ref 7–25)
Bilirubin, Total: 0.5 mg/dL (ref 0.2–1.2)
Calcium: 9.1 mg/dL (ref 8.6–10.3)
Carbon Dioxide: 23 mmol/L (ref 20–32)
Chloride: 104 mmol/L (ref 98–110)
Creatinine: 0.97 mg/dL (ref 0.70–1.30)
EGFR: 92 mL/min/{1.73_m2} (ref >=60)
Globulin: 2.4 g/dL (calc) (ref 1.9–3.7)
Glucose: 114 mg/dL — ABNORMAL HIGH (ref 65–99)
Potassium: 4.5 mmol/L (ref 3.5–5.3)
Sodium: 140 mmol/L (ref 135–146)
Total Protein: 6.8 g/dL (ref 6.1–8.1)

## 2023-02-15 LAB — URINALYSIS WITH CULTURE REFLEX, WHEN INDICATED
Bilrubin: NEGATIVE
Blood: NEGATIVE
Glucose: NEGATIVE
Hyaline Casts: NONE SEEN /LPF
Ketones: NEGATIVE
Leukocyte Esterase: NEGATIVE
Nitrite: NEGATIVE
Specific Gravity: 1.024 (ref 1.001–1.035)
Squam EpithelialCells: NONE SEEN /HPF (ref <=5)
WBC: NONE SEEN /HPF (ref <=5)
pH: 6 (ref 5.0–8.0)

## 2023-02-15 LAB — CHOLESTEROL/HDLC RATIO-QUEST: Chol/HDLC Ratio: 2.7 (calc) (ref <5.0)

## 2023-02-15 LAB — GLYCOSYLATED HGB(A1C), BLOOD: Hgb A1C: 7.8 % of total Hgb — ABNORMAL HIGH (ref <5.7)

## 2023-02-15 LAB — CHOLESTEROL, TOTAL BLOOD: Cholesterol: 161 mg/dL (ref <200)

## 2023-02-15 LAB — HDL-CHOLESTEROL, BLOOD: HDL Cholesterol: 60 mg/dL (ref >=40)

## 2023-02-15 LAB — MICROALBUMIN, RANDOM URINE - QUEST
Microalbumin Random: 16.1 mg/dL
Microalbumin Ratio: 96 mg/g creat — ABNORMAL HIGH (ref <30)

## 2023-02-15 LAB — NON HDL CHOLESTEROL -QUEST: Non-HDL Cholesterol: 101 mg/dL (calc) (ref <130)

## 2023-02-15 LAB — VITAMIN D, 25-OH TOTAL: Vitamin D, 25-OH, Total: 24 ng/mL — ABNORMAL LOW (ref 30–100)

## 2023-02-15 LAB — TSH, BLOOD: TSH: 0.98 mIU/L (ref 0.40–4.50)

## 2023-02-15 LAB — TRIGLYCERIDES, BLOOD: Triglycerides: 159 mg/dL — ABNORMAL HIGH (ref <150)

## 2023-02-15 LAB — SPECIMEN INTEGRITY COMPROMISED

## 2023-02-15 LAB — REFLEXIVE URINE CULTURE-QUEST

## 2023-02-15 LAB — RANDOM URINE CREATININE: Creatinine, Random Urine: 168 mg/dL (ref 20–320)

## 2023-02-15 LAB — LDL CHOLESTEROL, DIRECT: LDL-Cholesterol: 75 mg/dL (calc)

## 2023-02-16 LAB — HEPATITIS C AB, BLOOD: Hepatitis C Ab: NONREACTIVE

## 2023-02-17 ENCOUNTER — Other Ambulatory Visit (INDEPENDENT_AMBULATORY_CARE_PROVIDER_SITE_OTHER): Payer: Self-pay | Admitting: Emergency Medicine

## 2023-02-17 DIAGNOSIS — E119 Type 2 diabetes mellitus without complications: Secondary | ICD-10-CM

## 2023-02-17 MED ORDER — TRUE METRIX BLOOD GLUCOSE TEST VI STRP
ORAL_STRIP | Freq: Three times a day (TID) | 99 refills | Status: DC
Start: 2023-02-17 — End: 2023-02-18

## 2023-02-18 ENCOUNTER — Ambulatory Visit (AMBULATORY_SURGERY_CENTER): Payer: 59

## 2023-02-18 ENCOUNTER — Encounter: Payer: Self-pay | Admitting: Gastroenterology

## 2023-02-18 ENCOUNTER — Other Ambulatory Visit (INDEPENDENT_AMBULATORY_CARE_PROVIDER_SITE_OTHER): Payer: Self-pay

## 2023-02-18 VITALS — Ht 68.0 in | Wt 220.0 lb

## 2023-02-18 DIAGNOSIS — E119 Type 2 diabetes mellitus without complications: Secondary | ICD-10-CM

## 2023-02-18 DIAGNOSIS — Z1211 Encounter for screening for malignant neoplasm of colon: Secondary | ICD-10-CM

## 2023-02-18 MED ORDER — NA SULFATE-K SULFATE-MG SULF 17.5-3.13-1.6 GM/177ML PO SOLN: 1.0000 | Freq: Once | ORAL | 0 refills | Status: AC

## 2023-02-18 MED ORDER — TRUE METRIX BLOOD GLUCOSE TEST VI STRP
1.0000 | ORAL_STRIP | Freq: Every day | 3 refills | Status: AC
Start: 2023-02-18 — End: ?

## 2023-02-18 NOTE — Progress Notes (Signed)
Pre visit completed via phone call; Patient verified name, DOB, and address;  No egg or soy allergy known to patient;  No issues known to pt with past sedation with any surgeries or procedures; Patient denies ever being told they had issues or difficulty with intubation;  No FH of Malignant Hyperthermia; Pt is not on diet pills; Pt is not on home 02;  Pt is not on blood thinners;  Pt denies issues with constipation;  No A fib or A flutter; Have any cardiac testing pending--NO Insurance verified during PV appt--- UHC  Pt can ambulate without assistance;  Pt denies use of chewing tobacco; Discussed diabetic/weight loss medication holds; Discussed NSAID holds; Checked BMI to be less than 50; Pt instructed to use Singlecare.com or GoodRx for a price reduction on prep  Patient's chart reviewed by Cathlyn Parsons CNRA prior to previsit and patient appropriate for the [facility].  Pre visit completed and red dot placed by patient's name on their procedure day (on provider's schedule).    Instructions sent to patient's MyChart as well as printed and placed at the 2nd floor receptionist for patient to pick up;

## 2023-02-20 MED ORDER — GLUCOSE BLOOD VI STRP
1.0000 | ORAL_STRIP | Freq: Every day | 11 refills | Status: AC
Start: 2023-02-20 — End: ?

## 2023-02-20 MED ORDER — BLOOD GLUCOSE METER KIT
PACK | 0 refills | Status: AC
Start: 2023-02-20 — End: ?

## 2023-02-20 MED ORDER — LANCETS MISC
1.0000 | Freq: Every day | 11 refills | Status: AC
Start: 2023-02-20 — End: ?

## 2023-02-28 ENCOUNTER — Encounter: Payer: Self-pay | Admitting: Gastroenterology

## 2023-02-28 ENCOUNTER — Ambulatory Visit (AMBULATORY_SURGERY_CENTER): Payer: 59 | Admitting: Gastroenterology

## 2023-02-28 VITALS — BP 123/78 | HR 49 | Temp 98.4°F | Resp 13 | Ht 68.0 in | Wt 220.0 lb

## 2023-02-28 DIAGNOSIS — K635 Polyp of colon: Secondary | ICD-10-CM

## 2023-02-28 DIAGNOSIS — D120 Benign neoplasm of cecum: Secondary | ICD-10-CM

## 2023-02-28 DIAGNOSIS — Z86010 Personal history of colonic polyps: Secondary | ICD-10-CM

## 2023-02-28 DIAGNOSIS — D125 Benign neoplasm of sigmoid colon: Secondary | ICD-10-CM

## 2023-02-28 DIAGNOSIS — Z09 Encounter for follow-up examination after completed treatment for conditions other than malignant neoplasm: Secondary | ICD-10-CM

## 2023-02-28 DIAGNOSIS — D123 Benign neoplasm of transverse colon: Secondary | ICD-10-CM

## 2023-02-28 MED ORDER — SODIUM CHLORIDE 0.9 % IV SOLN
500.0000 mL | Freq: Once | INTRAVENOUS | Status: DC
Start: 2023-02-28 — End: 2023-02-28

## 2023-02-28 NOTE — Progress Notes (Signed)
Pt's states no medical or surgical changes since previsit or office visit. 

## 2023-02-28 NOTE — Progress Notes (Signed)
History and Physical:  This patient presents for endoscopic testing for: Encounter Diagnosis  Name Primary?   Personal history of colonic polyps Yes    Surveillance colonoscopy today.  Diminutive hepatic flexure polyp May 2019 - removed, not retrieved. Patient denies chronic abdominal pain, rectal bleeding, constipation or diarrhea.   Patient is otherwise without complaints or active issues today.   Past Medical History: Past Medical History:  Diagnosis Date   Angio-edema    Diabetes mellitus, type 2 (HCC)    on meds   Essential hypertension 12/31/2019   on meds   GERD (gastroesophageal reflux disease)    on meds   Hyperlipidemia    on meds   Positive PPD 1999   INH x 6 months   PPD negative 2001   Urticaria      Past Surgical History: Past Surgical History:  Procedure Laterality Date   COLONOSCOPY  2019   HD-MAC-Plenvu(exc)-hems/fecal matter    Allergies: No Known Allergies  Outpatient Meds: Current Outpatient Medications  Medication Sig Dispense Refill   amLODipine (NORVASC) 5 MG tablet Take 1 tablet (5 mg total) by mouth daily. (Patient taking differently: Take 5 mg by mouth every other day.) 90 tablet 1   atorvastatin (LIPITOR) 20 MG tablet Take 1 tablet (20 mg total) by mouth at bedtime. (Patient taking differently: Take 20 mg by mouth every other day.) 90 tablet 1   cetirizine (ZYRTEC) 10 MG tablet Take 1 tablet (10 mg total) by mouth 2 (two) times daily. (Patient taking differently: Take 10 mg by mouth daily as needed for allergies.) 60 tablet 5   hydrochlorothiazide (HYDRODIURIL) 12.5 MG tablet Take 1 tablet (12.5 mg total) by mouth daily. (Patient taking differently: Take 12.5 mg by mouth daily as needed.) 90 tablet 1   metFORMIN (GLUCOPHAGE) 850 MG tablet Take 1 tablet (850 mg total) by mouth 2 (two) times daily with a meal. (Patient taking differently: Take 850 mg by mouth 2 (two) times daily with a meal. Taking every other day per pt (02/18/2023)) 180  tablet 1   omeprazole (PRILOSEC) 20 MG capsule Take 1 capsule (20 mg total) by mouth daily. (Patient taking differently: Take 20 mg by mouth daily as needed (for acid reflux).) 30 capsule 1   Current Facility-Administered Medications  Medication Dose Route Frequency Provider Last Rate Last Admin   0.9 %  sodium chloride infusion  500 mL Intravenous Once Charlie Pitter III, MD          ___________________________________________________________________ Objective   Exam:  BP 121/85   Pulse 62   Temp 98.4 F (36.9 C) (Temporal)   Ht 5\' 8"  (1.727 m)   Wt 220 lb (99.8 kg)   SpO2 97%   BMI 33.45 kg/m   CV: regular , S1/S2 Resp: clear to auscultation bilaterally, normal RR and effort noted GI: soft, no tenderness, with active bowel sounds.   Assessment: Encounter Diagnosis  Name Primary?   Personal history of colonic polyps Yes     Plan: Colonoscopy   The benefits and risks of the planned procedure were described in detail with the patient or (when appropriate) their health care proxy.  Risks were outlined as including, but not limited to, bleeding, infection, perforation, adverse medication reaction leading to cardiac or pulmonary decompensation, pancreatitis (if ERCP).  The limitation of incomplete mucosal visualization was also discussed.  No guarantees or warranties were given.  The patient is appropriate for an endoscopic procedure in the ambulatory setting.   - Amada Jupiter,  MD

## 2023-02-28 NOTE — Patient Instructions (Addendum)
- Resume previous diet.                           - Continue present medications.                           - Await pathology results. (3 polyps)                           - Repeat colonoscopy is recommended for                            surveillance. The colonoscopy date will be                            determined after pathology results from today's                            exam become available for review  YOU HAD AN ENDOSCOPIC PROCEDURE TODAY AT THE [facility]:   Refer to the procedure report that was given to you for any specific questions about what was found during the examination.  If the procedure report does not answer your questions, please call your gastroenterologist to clarify.  If you requested that your care partner not be given the details of your procedure findings, then the procedure report has been included in a sealed envelope for you to review at your convenience later.  YOU SHOULD EXPECT: Some feelings of bloating in the abdomen. Passage of more gas than usual.  Walking can help get rid of the air that was put into your GI tract during the procedure and reduce the bloating. If you had a lower endoscopy (such as a colonoscopy or flexible sigmoidoscopy) you may notice spotting of blood in your stool or on the toilet paper. If you underwent a bowel prep for your procedure, you may not have a normal bowel movement for a few days.  Please Note:  You might notice some irritation and congestion in your nose or some drainage.  This is from the oxygen used during your procedure.  There is no need for concern and it should clear up in a day or so.  SYMPTOMS TO REPORT IMMEDIATELY:  Following lower endoscopy (colonoscopy or flexible sigmoidoscopy):  Excessive amounts of blood in the stool  Significant tenderness or worsening of abdominal pains  Swelling of the abdomen that is new, acute  Fever of 100F or higher   For urgent or emergent  issues, a gastroenterologist can be reached at any hour by calling [phone]. Do not use MyChart messaging for urgent concerns.    DIET:  We do recommend a small meal at first, but then you may proceed to your regular diet.  Drink plenty of fluids but you should avoid alcoholic beverages for 24 hours.  ACTIVITY:  You should plan to take it easy for the rest of today and you should NOT DRIVE or use heavy machinery until tomorrow (because of the sedation medicines used during the test).    FOLLOW UP: Our staff will call the number listed on your records the next business day following your procedure.  We will call around 7:15- 8:00 am to check on you and address any questions or concerns that you  may have regarding the information given to you following your procedure. If we do not reach you, we will leave a message.     If any biopsies were taken you will be contacted by phone or by letter within the next 1-3 weeks.  Please call us at [phone] if you have not heard about the biopsies in 3 weeks.    SIGNATURES/CONFIDENTIALITY: You and/or your care partner have signed paperwork which will be entered into your electronic medical record.  These signatures attest to the fact that that the information above on your After Visit Summary has been reviewed and is understood.  Full responsibility of the confidentiality of this discharge information lies with you and/or your care-partner.

## 2023-02-28 NOTE — Progress Notes (Signed)
Uneventful anesthetic. Report to pacu rn. Vss. Care resumed by rn. 

## 2023-02-28 NOTE — Op Note (Signed)
[facility] Patient Name: Bryan Shaw Procedure Date: 02/28/2023 8:52 AM MRN: [id number] Endoscopist: Sherilyn Cooter L. Myrtie Neither , MD, [id number] Age: 57 Referring MD:  Date of Birth: 1965/07/28 Gender: Male Account #: [id number] Procedure:                Colonoscopy Indications:              Surveillance: Personal history of colonic polyps                            (unknown histology) on last colonoscopy 5 years ago                           May 2019 (first screening colonoscopy) -diminutive                            flexure polyp removed, not retrieved Medicines:                Monitored Anesthesia Care Procedure:                Pre-Anesthesia Assessment:                           - Prior to the procedure, a History and Physical                            was performed, and patient medications and                            allergies were reviewed. The patient's tolerance of                            previous anesthesia was also reviewed. The risks                            and benefits of the procedure and the sedation                            options and risks were discussed with the patient.                            All questions were answered, and informed consent                            was obtained. Prior Anticoagulants: The patient has                            taken no anticoagulant or antiplatelet agents. ASA                            Grade Assessment: II - A patient with mild systemic                            disease. After reviewing the risks and benefits,  the patient was deemed in satisfactory condition to                            undergo the procedure.                           After obtaining informed consent, the colonoscope                            was passed under direct vision. Throughout the                            procedure, the patient's blood pressure, pulse, and                            oxygen saturations  were monitored continuously. The                            Olympus Scope SN: [id number] was introduced through                            the anus and advanced to the the cecum, identified                            by appendiceal orifice and ileocecal valve. The                            colonoscopy was performed without difficulty. The                            patient tolerated the procedure well. The quality                            of the bowel preparation was excellent. The                            ileocecal valve, appendiceal orifice, and rectum                            were photographed. Scope In: 9:03:36 AM Scope Out: 9:18:31 AM Scope Withdrawal Time: 0 hours 10 minutes 55 seconds  Total Procedure Duration: 0 hours 14 minutes 55 seconds  Findings:                 The perianal and digital rectal examinations were                            normal.                           Repeat examination of right colon under NBI                            performed.  A diminutive polyp was found in the cecum. The                            polyp was sessile. The polyp was removed with a                            cold snare. Resection and retrieval were complete.                           Two semi-sessile polyps were found in the distal                            sigmoid colon and splenic flexure. The polyps were                            5 to 8 mm in size. These polyps were removed with a                            cold snare. Resection and retrieval were complete.                           Internal hemorrhoids were found.                           The exam was otherwise without abnormality on                            direct and retroflexion views. Complications:            No immediate complications. Estimated Blood Loss:     Estimated blood loss was minimal. Impression:               - One diminutive polyp in the cecum, removed with a                             cold snare. Resected and retrieved.                           - Two 5 to 8 mm polyps in the distal sigmoid colon                            and at the splenic flexure, removed with a cold                            snare. Resected and retrieved.                           - Internal hemorrhoids.                           - The examination was otherwise normal on direct                            and retroflexion views. Recommendation:           -  Patient has a contact number available for                            emergencies. The signs and symptoms of potential                            delayed complications were discussed with the                            patient. Return to normal activities tomorrow.                            Written discharge instructions were provided to the                            patient.                           - Resume previous diet.                           - Continue present medications.                           - Await pathology results.                           - Repeat colonoscopy is recommended for                            surveillance. The colonoscopy date will be                            determined after pathology results from today's                            exam become available for review. Tyus Kallam L. Myrtie Neither, MD 02/28/2023 9:22:22 AM This report has been signed electronically.

## 2023-03-01 ENCOUNTER — Telehealth: Payer: Self-pay | Admitting: *Deleted

## 2023-03-01 NOTE — Telephone Encounter (Signed)
  Follow up Call-     02/28/2023    8:18 AM  Call back number  Post procedure Call Back phone  # [phone]  Permission to leave phone message Yes   [facility]

## 2023-03-11 ENCOUNTER — Telehealth (INDEPENDENT_AMBULATORY_CARE_PROVIDER_SITE_OTHER): Payer: Self-pay

## 2023-03-11 DIAGNOSIS — Z712 Person consulting for explanation of examination or test findings: Secondary | ICD-10-CM

## 2023-03-11 NOTE — Progress Notes (Signed)
 Pt called to review lab results, no answer, voicemail left asking pt to call back. Will try calling pt back.

## 2023-03-12 ENCOUNTER — Encounter: Payer: Self-pay | Admitting: Gastroenterology

## 2023-03-20 ENCOUNTER — Telehealth (INDEPENDENT_AMBULATORY_CARE_PROVIDER_SITE_OTHER): Payer: Self-pay

## 2023-03-20 DIAGNOSIS — Z712 Person consulting for explanation of examination or test findings: Secondary | ICD-10-CM

## 2023-03-20 NOTE — Progress Notes (Signed)
Pt called to review lab results, no answer, voicemail left asking pt to call back. Will try calling pt back.   Will have front desk send letter asking pt to contact our office.

## 2023-05-01 ENCOUNTER — Ambulatory Visit: Payer: 59 | Admitting: Internal Medicine

## 2023-05-09 ENCOUNTER — Encounter: Payer: Self-pay | Admitting: Internal Medicine

## 2023-06-12 ENCOUNTER — Other Ambulatory Visit: Payer: Self-pay | Admitting: Internal Medicine

## 2023-08-13 ENCOUNTER — Ambulatory Visit (INDEPENDENT_AMBULATORY_CARE_PROVIDER_SITE_OTHER): Admitting: Emergency Medicine

## 2023-08-13 VITALS — BP 140/75 | HR 87 | Temp 97.8°F | Resp 16 | Ht 68.0 in | Wt 188.8 lb

## 2023-08-13 MED ORDER — SHINGRIX 50 MCG/0.5ML IM SUSR
0.5000 mL | Freq: Once | INTRAMUSCULAR | 0 refills | Status: AC
Start: 2023-08-13 — End: 2023-08-13

## 2023-08-13 MED ORDER — PNEUMOCOCCAL VAC POLYVALENT 25 MCG/0.5ML IJ INJ (CUSTOM)
0.5000 mL | INJECTION | Freq: Once | INTRAMUSCULAR | 0 refills | Status: AC
Start: 2023-08-13 — End: 2023-08-13

## 2023-08-13 MED ORDER — JANUVIA 100 MG OR TABS
100.0000 mg | ORAL_TABLET | Freq: Every day | ORAL | 3 refills | Status: AC
Start: 2023-08-13 — End: ?

## 2023-08-13 MED ORDER — ALBUTEROL SULFATE 108 (90 BASE) MCG/ACT IN AERS
2.0000 | INHALATION_SPRAY | Freq: Four times a day (QID) | RESPIRATORY_TRACT | 2 refills | Status: AC | PRN
Start: 2023-08-13 — End: ?

## 2023-08-13 MED ORDER — PIOGLITAZONE HCL 30 MG OR TABS
30.0000 mg | ORAL_TABLET | Freq: Every day | ORAL | 3 refills | Status: AC
Start: 2023-08-13 — End: ?

## 2023-08-13 MED ORDER — LOSARTAN POTASSIUM 50 MG OR TABS
50.0000 mg | ORAL_TABLET | Freq: Every day | ORAL | 3 refills | Status: AC
Start: 2023-08-13 — End: ?

## 2023-08-13 NOTE — Progress Notes (Signed)
Subjective :  Matthew Estes is a 58 year old male who is here for Follow Up (58 yo,male come to the clinic for Follow up,by: E Jayme Cloud MA.)      Matthew Estes is a 58 YO M who presents for DM II follow up. Pt states he is stable on current treatment. Reports BG 110 in am.   Pt reports Hx of Covid infection with intermittent SOB with exertion.   DM retinal exam never done, ordered this visit  DM foot exam ordered    Colonoscopy never done, ordered this visit      Issues discussed today:  DM II  HTN  Hyperlipidemia   SOB    Review of Systems   Constitutional: Negative.    HENT: Negative.     Eyes: Negative.    Respiratory:  Positive for shortness of breath.    Gastrointestinal: Negative.    Endocrine: Negative.    Genitourinary: Negative.    Musculoskeletal: Negative.    Skin: Negative.    Allergic/Immunologic: Negative.    Neurological: Negative.    Hematological: Negative.    Psychiatric/Behavioral: Negative.           atorvastatin Take 1 tablet (40 mg) by mouth daily for 30 days. 30 tablet 5    blood glucose PHARMACY: Please dispense insurance approved bluetooth enable meter. 1 each 0    True Metrix Blood Glucose Test 1 strip by Other route every morning (before breakfast). 100 strip 3    glucose blood 1 strip by Other route every morning (before breakfast). 100 strip 11    lancets 1 each by Other route every morning (before breakfast). 100 Lancet. 11    losartan Take 1 tablet (50 mg) by mouth daily. 90 tablet 3    metFORMIN TAKE 1 TABLET BY MOUTH TWICE A DAY WITH MEALS 180 tablet 3    omega-3 acid ethyl esters TAKE TWO CAPSULE BY MOUTH TWICE DAILY FOR CHOLESTEROL      pioglitazone Take 1 tablet (30 mg) by mouth daily. 90 tablet 3    Januvia Take 1 tablet (100 mg) by mouth daily. 90 tablet 3     No Known Allergies    Reviewed patients pertinent information related to social history, past medical, past surgical, and family history.     Objective :  Vital signs: BP 140/75 (BP Location: Left arm, BP Patient Position:  Sitting, BP cuff size: Regular)   Pulse 87   Temp 97.8 F (36.6 C) (Temporal)   Resp 16   Ht 5\' 8"  (1.727 m)   Wt 85.6 kg (188 lb 12.8 oz)   SpO2 98%   BMI 28.71 kg/m     Physical Exam  Vitals reviewed.   Constitutional:       Appearance: Normal appearance. He is normal weight.   HENT:      Head: Normocephalic and atraumatic.   Cardiovascular:      Rate and Rhythm: Normal rate and regular rhythm.      Pulses: Normal pulses.      Heart sounds: Normal heart sounds.   Pulmonary:      Effort: Pulmonary effort is normal.      Breath sounds: Normal breath sounds.   Abdominal:      General: Abdomen is flat. Bowel sounds are normal.   Musculoskeletal:         General: Normal range of motion.      Cervical back: Normal range of motion and neck supple.   Skin:  General: Skin is warm and dry.   Neurological:      General: No focal deficit present.      Mental Status: He is alert and oriented to person, place, and time.   Psychiatric:         Mood and Affect: Mood normal.         Behavior: Behavior normal.         Thought Content: Thought content normal.         Judgment: Judgment normal.          Assessment/Plan:  Assessment & Plan  Controlled type 2 diabetes mellitus without complication, with long-term current use of insulin (CMS-HCC)    Orders:    Consult/Referral to [facility]    Consult/Referral to Ophthalmology    pioglitazone (ACTOS) 30 mg tablet; Take 1 tablet (30 mg) by mouth daily.    SITagliptin (JANUVIA) 100 mg tablet; Take 1 tablet (100 mg) by mouth daily.    Glycosylated Hgb(A1C), Blood Lavender; Future    Glycosylated Hgb(A1C), Blood Lavender    Random Urine Microalb/Creat Ratio Panel; Future    Random Urine Microalb/Creat Ratio Panel  Stable on current treatment. ADA diet, exercise recommendations, and ER precautions reviewed.  Screening for colon cancer    Orders:    Consult/Referral to GI for Colonoscopy Screening  Stable  Need for vaccination    Orders:    pneumococcal  23-valent vaccine (PNEUMOVAX-23) 25 MCG/0.5ML injection; 0.5 mL by IntraMUSCULAR route once for 1 dose.    herpes zoster recombinant adjuvanted vaccine (SHINGRIX) 50 MCG/0.5ML IM injection; 0.5 mL by IntraMUSCULAR route once for 1 dose.  Stable  Essential hypertension    Orders:    losartan (COZAAR) 50 MG tablet; Take 1 tablet (50 mg) by mouth daily.  Stable on current treatment, monitor BP at home, keep diary log and bring back next visit. DASH diet, exercise recommendations and ER precautions reviewed.  Mixed hyperlipidemia       High Cholesterol    High cholesterol is a condition in which the blood has high levels of a white, waxy, fat-like substance (cholesterol). The human body needs small amounts of cholesterol. The liver makes all the cholesterol that the body needs. Extra (excess) cholesterol comes from the food that we eat.  Cholesterol is carried from the liver by the blood through the blood vessels. If you have high cholesterol, deposits (plaques) may build up on the walls of your blood vessels (arteries). Plaques make the arteries narrower and stiffer. Cholesterol plaques increase your risk for heart attack and stroke. Work with your health care provider to keep your cholesterol levels in a healthy range.  What increases the risk?  This condition is more likely to develop in people who:  Eat foods that are high in animal fat (saturated fat) or cholesterol.  Are overweight.  Are not getting enough exercise.  Have a family history of high cholesterol.  What are the signs or symptoms?  There are no symptoms of this condition.  How is this diagnosed?  This condition may be diagnosed from the results of a blood test.  If you are older than age 64, your health care provider may check your cholesterol every 4-6 years.  You may be checked more often if you already have high cholesterol or other risk factors for heart disease.  The blood test for cholesterol measures:  "Bad" cholesterol (LDL cholesterol). This is  the main type of cholesterol that causes heart disease. The  desired level for LDL is less than 100.  "Good" cholesterol (HDL cholesterol). This type helps to protect against heart disease by cleaning the arteries and carrying the LDL away. The desired level for HDL is 60 or higher.  Triglycerides. These are fats that the body can store or burn for energy. The desired number for triglycerides is lower than 150.  Total cholesterol. This is a measure of the total amount of cholesterol in your blood, including LDL cholesterol, HDL cholesterol, and triglycerides. A healthy number is less than 200.  How is this treated?  This condition is treated with diet changes, lifestyle changes, and medicines.  Diet changes  This may include eating more whole grains, fruits, vegetables, nuts, and fish.  This may also include cutting back on red meat and foods that have a lot of added sugar.  Lifestyle changes  Changes may include getting at least 40 minutes of aerobic exercise 3 times a week. Aerobic exercises include walking, biking, and swimming. Aerobic exercise along with a healthy diet can help you maintain a healthy weight.  Changes may also include quitting smoking.  Medicines  Medicines are usually given if diet and lifestyle changes have failed to reduce your cholesterol to healthy levels.  Your health care provider may prescribe a statin medicine. Statin medicines have been shown to reduce cholesterol, which can reduce the risk of heart disease.  Follow these instructions at home:  Eating and drinking  If told by your health care provider:  Eat chicken (without skin), fish, veal, shellfish, ground [place] breast, and round or loin cuts of red meat.  Do not eat fried foods or fatty meats, such as hot dogs and salami.  Eat plenty of fruits, such as apples.  Eat plenty of vegetables, such as broccoli, potatoes, and carrots.  Eat beans, peas, and lentils.  Eat grains such as barley, rice, couscous, and bulgur wheat.  Eat pasta  without cream sauces.  Use skim or nonfat milk, and eat low-fat or nonfat yogurt and cheeses.  Do not eat or drink whole milk, cream, ice cream, egg yolks, or hard cheeses.  Do not eat stick margarine or tub margarines that contain trans fats (also called partially hydrogenated oils).  Do not eat saturated tropical oils, such as coconut oil and palm oil.  Do not eat cakes, cookies, crackers, or other baked goods that contain trans fats.    General instructions  Exercise as directed by your health care provider. Increase your activity level with activities such as gardening, walking, and taking the stairs.  Take over-the-counter and prescription medicines only as told by your health care provider.  Do not use any products that contain nicotine or tobacco, such as cigarettes and e-cigarettes. If you need help quitting, ask your health care provider.  Keep all follow-up visits as told by your health care provider. This is important.  Contact a health care provider if:  You are struggling to maintain a healthy diet or weight.  You need help to start on an exercise program.  You need help to stop smoking.  Get help right away if:  You have chest pain.  You have trouble breathing.  This information is not intended to replace advice given to you by your health care provider. Make sure you discuss any questions you have with your health care provider.  History of COVID-19    Orders:    X-Ray Chest Frontal And Lateral; Future    albuterol 108 (90 Base) MCG/ACT inhaler;  Inhale 2 puffs by mouth every 6 hours as needed for Wheezing.  Chest x-ray ordered, initiated on albuterol inhaler, pt educated on medication and potential side effects.   SOB (shortness of breath) on exertion    Orders:    X-Ray Chest Frontal And Lateral; Future    albuterol 108 (90 Base) MCG/ACT inhaler; Inhale 2 puffs by mouth every 6 hours as needed for Wheezing.  Shortness of Breath, Adult  Shortness of breath means you have trouble breathing. Shortness  of breath could be a sign of a medical problem.  Follow these instructions at home:    Watch for any changes in your symptoms.  Do not use any products that contain nicotine or tobacco, such as cigarettes, e-cigarettes, and chewing tobacco.  Do not smoke. Smoking can cause shortness of breath. If you need help to quit smoking, ask your doctor.  Avoid things that can make it harder to breathe, such as:  Mold.  Dust.  Air pollution.  Chemical smells.  Things that can cause allergy symptoms (allergens), if you have allergies.  Keep your living space clean. Use products that help remove mold and dust.  Rest as needed. Slowly return to your normal activities.  Take over-the-counter and prescription medicines only as told by your doctor. This includes oxygen therapy and inhaled medicines.  Keep all follow-up visits as told by your doctor. This is important.  Contact a doctor if:  Your condition does not get better as soon as expected.  You have a hard time doing your normal activities, even after you rest.  You have new symptoms.  Get help right away if:  Your shortness of breath gets worse.  You have trouble breathing when you are resting.  You feel light-headed or you pass out (faint).  You have a cough that is not helped by medicines.  You cough up blood.  You have pain with breathing.  You have pain in your chest, arms, shoulders, or belly (abdomen).  You have a fever.  You cannot walk up stairs.  You cannot exercise the way you normally do.  These symptoms may represent a serious problem that is an emergency. Do not wait to see if the symptoms will go away. Get medical help right away. Call your local emergency services (911 in the [place] Do not drive yourself to the hospital.  Summary  Shortness of breath is when you have trouble breathing enough air. It can be a sign of a medical problem.  Avoid things that make it hard for you to breathe, such as smoking, pollution, mold, and dust.  Watch for any changes in your  symptoms. Contact your doctor if you do not get better or you get worse.  This information is not intended to replace advice given to you by your health care provider. Make sure you discuss any questions you have with your health care provider.  Nutrition and healthy food. Exercise. low fat diet. high fiber diet. Take medications as directed. Emergency room instructions were given.  Follow up in 7 days.

## 2023-08-14 LAB — RANDOM URINE CREATININE: Creatinine, Random Urine: 66 mg/dL (ref 20–320)

## 2023-08-14 LAB — MICROALBUMIN, RANDOM URINE - QUEST
Microalbumin Random: 4.6 mg/dL
Microalbumin Ratio: 70 mg/g{creat} — ABNORMAL HIGH (ref <30)

## 2023-08-14 LAB — GLYCOSYLATED HGB(A1C), BLOOD: Hgb A1C: 8.5 %{Hb} — ABNORMAL HIGH (ref <5.7)

## 2023-10-01 ENCOUNTER — Ambulatory Visit (INDEPENDENT_AMBULATORY_CARE_PROVIDER_SITE_OTHER): Admitting: Emergency Medicine

## 2023-10-01 VITALS — BP 134/82 | HR 90 | Temp 97.8°F | Resp 16 | Ht 68.0 in | Wt 176.0 lb

## 2023-10-01 MED ORDER — ASPIRIN 81 MG OR TBEC
81.0000 mg | DELAYED_RELEASE_TABLET | Freq: Every day | ORAL | 3 refills | Status: AC
Start: 2023-10-01 — End: ?

## 2023-10-01 MED ORDER — TICAGRELOR 90 MG PO TABS
90.0000 mg | ORAL_TABLET | Freq: Two times a day (BID) | ORAL | 3 refills | Status: AC
Start: 2023-10-01 — End: ?

## 2023-10-01 NOTE — Progress Notes (Addendum)
 Subjective :  Matthew Estes is a 58 year old male who is here for Follow Up      Matthew Estes is a 58 YO M who presents for ER/Hospital admission follow up. Pt was admitted to the hospital on 09/27/2023 with a diagnosis of NSTEMI. Pt had two stent placements, DC on brilinta, ozempic, ASA, carvedilol, Jardiance, furosemide, and entresto. Pt states chest pain has resolve      Issues discussed today:  Status post MI  Status post stent placement    Review of Systems   Constitutional:  Positive for activity change and fatigue.   HENT: Negative.     Eyes: Negative.    Respiratory: Negative.     Cardiovascular: Negative.    Gastrointestinal: Negative.    Endocrine: Negative.    Genitourinary: Negative.    Musculoskeletal: Negative.    Skin: Negative.    Allergic/Immunologic: Negative.    Neurological: Negative.    Hematological: Negative.    Psychiatric/Behavioral: Negative.           albuterol Inhale 2 puffs by mouth every 6 hours as needed for Wheezing. 8 g 2    atorvastatin Take 1 tablet (40 mg) by mouth daily for 30 days. 30 tablet 5    blood glucose PHARMACY: Please dispense insurance approved bluetooth enable meter. 1 each 0    True Metrix Blood Glucose Test 1 strip by Other route every morning (before breakfast). 100 strip 3    glucose blood 1 strip by Other route every morning (before breakfast). 100 strip 11    lancets 1 each by Other route every morning (before breakfast). 100 Lancet. 11    losartan Take 1 tablet (50 mg) by mouth daily. 90 tablet 3    metFORMIN TAKE 1 TABLET BY MOUTH TWICE A DAY WITH MEALS 180 tablet 3    omega-3 acid ethyl esters TAKE TWO CAPSULE BY MOUTH TWICE DAILY FOR CHOLESTEROL      pioglitazone Take 1 tablet (30 mg) by mouth daily. 90 tablet 3    Januvia Take 1 tablet (100 mg) by mouth daily. 90 tablet 3     No Known Allergies    Reviewed patients pertinent information related to social history, past medical, past surgical, and family history.     Objective :  Vital signs: There were no  vitals taken for this visit.    Physical Exam  Vitals reviewed.   Constitutional:       Appearance: Normal appearance. He is normal weight.   HENT:      Head: Normocephalic and atraumatic.   Cardiovascular:      Rate and Rhythm: Normal rate and regular rhythm.      Pulses: Normal pulses.      Heart sounds: Normal heart sounds.   Pulmonary:      Effort: Pulmonary effort is normal.      Breath sounds: Normal breath sounds.   Abdominal:      General: Abdomen is flat. Bowel sounds are normal.   Musculoskeletal:         General: Normal range of motion.      Cervical back: Normal range of motion and neck supple.   Skin:     General: Skin is warm and dry.      Capillary Refill: Capillary refill takes less than 2 seconds.   Neurological:      General: No focal deficit present.      Mental Status: He is alert and oriented to person, place, and time.  Psychiatric:         Mood and Affect: Mood normal.         Behavior: Behavior normal.         Thought Content: Thought content normal.         Judgment: Judgment normal.          Discharge Summaries  - documented in this encounter   Matthew Pillar, MD - 09/28/2023 5:45 PM PDT  Formatting of this note is different from the original.  Name: Matthew Estes  MRN: [id number]  DOB: May 23, 1966    PCP: Fleet Contras, MD  Consulting physicians: Cards Welton Flakes)    Date of admission: 09/27/2023  Date of discharge: 09/28/2023    Chief Complaint  Patient presents with  Chest Pain  Chest pain with exertion since yesterday, radiates down to the stomach. Denies SOB. Denies f/c/n/v.    HPI:  58 y.o. male w DM2, HTN, obesity (BMI 31)  - who presented with chest pain, admitted with NSTEMI 3/28  - s/p angiogram 3/28 with DES x 2 (LAD, LCx) as well as moderate RCA disease admitted for    Discharge diagnoses (all present on admission unless noted otherwise):  Active Hospital Problems  Diagnosis Date Noted POA  (PP) NSTEMI (non-ST elevated myocardial infarction) (CMS/HCC) 09/27/2023 Yes  Assessment  & Plan Note:  Normocytic anemia 09/27/2023 Yes  Assessment & Plan Note:  Type 2 diabetes mellitus (CMS/HCC) Yes  Assessment & Plan Note:  Hypertension Yes  Assessment & Plan Note:  Hyperlipidemia Yes  Assessment & Plan Note:    Hospital Course:    # NSTEMI  # s/p angiogram 3/28 with DES x 2 (LAD, LCx) as well as moderate RCA disease  # Acute on chronic HF, EF unknown  # HTN on ARB  Patient presented with chest pain in setting of months of progressive exertional dyspnea/intolerance. Underwent angiogram requiring stents to LAD, LCx and moderate disease of RCA. Patient did have mild CHF that resolved with two doses of IV diuretics. After this, patient's symptoms were significantly improved. TTE was unremarkable  - ASA, ticagrelor, statin  - Furosemide 40 mg daily and low sodium diet counseled  - Carvedilol. Switch home ARB to ARNI  - F/u with cardiology in 2 weeks    # DM2 - SSI here. Stop pioglitazone as contraindicated in CHF. Switch sitagliptin to semaglutide. Add metformin, empagliflozin  # Comorbid class 1 obesity (BMI 31) - counseled diet/exercise, GLP-1 as above    Physical Exam:  I examined patient and they were not in any distress at time of discharge.    Important/Pending Labs and Imaging:  TTE: EF 60%, mild MR    Discharge medications:    Discharge Medications      Active Medications    Instructions  albuterol HFA 90 mcg/actuation inhaler  Commonly known as: PROVENTIL HFA,VENTOLIN HFA,PROAIR HFA    aspirin 81 mg tablet  Sig: 81 mg, oral, Daily    atorvastatin 80 mg tablet  Commonly known as: LIPITOR  Sig: 80 mg, oral, Every evening    carvediloL 3.125 mg tablet  Commonly known as: COREG  Sig: 3.125 mg, oral, 2 times daily    empagliflozin 10 mg tablet  Commonly known as: JARDIANCE  Sig: 10 mg, oral, Daily    furosemide 40 mg tablet  Commonly known as: LASIX  Sig: 40 mg, oral, Daily    metFORMIN 500 mg tablet  Commonly known as: GLUCOPHAGE  Sig: 500 mg, oral, 2 times daily  with breakfast and  dinner    nitroGLYcerin 0.4 mg SL tablet  Commonly known as: Nitrostat  Sig: 0.4 mg, sublingual, Every 5 min PRN    sacubitriL-valsartan 24-26 mg tablet  Commonly known as: ENTRESTO  Sig: 1 tablet, oral, 2 times daily    semaglutide 0.25 mg or 0.5 mg (2 mg/3 mL) pen injector pen injector  Commonly known as: OZEMPIC  Sig: 0.25 mg, subcutaneous, 1 time weekly    ticagrelor 90 mg tablet  Commonly known as: BRILINTA  Sig: 90 mg, oral, 2 times daily          Discontinued Medications    Januvia 100 mg tablet  Generic drug: sitaGLIPtin phosphate    losartan 50 mg tablet  Commonly known as: COZAAR    pioglitazone 30 mg tablet  Commonly known as: ACTOS            Pharmacy Information      These medications were sent to [id number] - [place], [place] - [address] AT by the [facility] [address], [place] [address]    Phone: [phone]  aspirin 81 mg tablet  atorvastatin 80 mg tablet  carvediloL 3.125 mg tablet  empagliflozin 10 mg tablet  furosemide 40 mg tablet  metFORMIN 500 mg tablet  nitroGLYcerin 0.4 mg SL tablet  sacubitriL-valsartan 24-26 mg tablet  semaglutide 0.25 mg or 0.5 mg (2 mg/3 mL) pen injector pen injector  ticagrelor 90 mg tablet      Instructions:  - Diet: Cardiac, diabetic  - Activity: as previously tolerated    Follow-up plan:  - Location: Home  - Appointments: PCP ASAP, cards 2 weeks  - PT/OT: Evaluated here, no needs    Discharge condition: stable    TIME SPENT PREPARING DISCHARGE GREATER THAN 35 MINUTES    Electronically signed by Matthew Pillar, MD at 09/29/2023 1:44 PM PDT   Discharge Instructions  - documented in this encounter   Discharge Instructions  Matthew Pillar, MD - 09/28/2023 8:46 AM PDT   Formatting of this note might be different from the original.  - You had a heart attack (no blood flow to the heart from cholesterol in your arteries) that required placement of stents. You MUST TAKE ASPIRIN AND BRILINTA WITHOUT SKIPPING A SINGLE DOSE OR THE STENTS WILL CLOSE AND YOU WILL  HAVE ANOTHER MAJOR HEART ATTACK. If for whatever reason you find yourself without these two medications, talk to your primary care doctor or go to the ED even if needed to make sure that you have these medications and do not miss them for the next 6 months at least. Take atorvastatin as well to help reduce cholesterol  - You have heart failure (fluid filling up in the body because the heart cannot pump fluid to the kidneys to get rid of it). To treat this, eat a low salt diet and take Lasix to remove fluid from the body. If possible, weigh yourself daily and take extra Lasix if you rapidly gain weight (1-2 pounds in a few days)  - Actos is not safe in patients with heart failure. To replace this, I have prescribed you metformin and Jardiance. Metformin can cause bloating/abdominal pain - if you have this, reduce your dose to 500 mg/day to see if it goes away before stopping completely. Jardiance works by helping you pee out sugar, so stop this medication if you are sick, not eating/dehydrated, or have a urinary tract infection. I have also replaced your  Januvia with a similar acting medication called Ozempic that works as a once-weekly injectable - this medication works by reducing your appetite and helping you lose weight so do not be surprised by this (but stop it if you have severe abdominal pain or nausea/vomiting)  - I have replaced your losartan with a similar medication called Entresto. Take this with Coreg to help your heart heal as well  - Follow-up with Dr Welton Flakes in clinic  Electronically signed by Matthew Pillar, MD at 09/28/2023 8:51 AM PDT     Medications at Time of Discharge  - documented as of this encounter   Medications at Time of Discharge  Medication Sig Dispense Quantity Refills Last Filled Start Date End Date   aspirin 81 mg tablet   Indications: NSTEMI (non-ST elevated myocardial infarction) (CMS/HCC) Take 1 tablet (81 mg total) by mouth daily. 30 tablet      09/29/2023 10/29/2023   atorvastatin  (LIPITOR) 80 mg tablet   Indications: NSTEMI (non-ST elevated myocardial infarction) (CMS/HCC) Take 1 tablet (80 mg total) by mouth every evening. 90 tablet      09/28/2023 12/27/2023   carvediloL (COREG) 3.125 mg tablet   Indications: Acute on chronic HFrEF (heart failure with reduced ejection fraction) (CMS/HCC) Take 1 tablet (3.125 mg total) by mouth 2 (two) times a day. 60 tablet      09/28/2023 10/28/2023   nitroGLYcerin (Nitrostat) 0.4 mg SL tablet   Indications: NSTEMI (non-ST elevated myocardial infarction) (CMS/HCC) Place 1 tablet (0.4 mg total) under the tongue every 5 (five) minutes as needed for chest pain for up to 30 doses. 30 tablet      09/28/2023     ticagrelor (BRILINTA) 90 mg tablet   Indications: NSTEMI (non-ST elevated myocardial infarction) (CMS/HCC) Take 1 tablet (90 mg total) by mouth 2 (two) times a day. 60 tablet      09/28/2023 10/28/2023   albuterol HFA (PROVENTIL HFA,VENTOLIN HFA,PROAIR HFA) 90 mcg/actuation inhaler  Inhale 2 puffs every 6 (six) hours as needed for shortness of breath or wheezing.             empagliflozin (JARDIANCE) 10 mg tablet   Indications: Type 2 diabetes mellitus without complication, without long-term current use of insulin (CMS/HCC) Take 1 tablet (10 mg total) by mouth daily. 90 tablet      09/28/2023 12/27/2023   furosemide (LASIX) 40 mg tablet   Indications: Acute on chronic HFrEF (heart failure with reduced ejection fraction) (CMS/HCC) Take 1 tablet (40 mg total) by mouth daily. 30 tablet      09/28/2023 10/28/2023   metFORMIN (GLUCOPHAGE) 500 mg tablet   Indications: Type 2 diabetes mellitus without complication, without long-term current use of insulin (CMS/HCC) Take 1 tablet (500 mg total) by mouth 2 (two) times a day with meals. 180 tablet      09/28/2023 12/27/2023   sacubitriL-valsartan (ENTRESTO) 24-26 mg tablet   Indications: Acute on chronic HFrEF (heart failure with reduced ejection fraction) (CMS/HCC) Take 1 tablet by mouth 2 (two) times a day. 60  tablet      09/28/2023 10/28/2023   semaglutide (OZEMPIC) 0.25 mg or 0.5 mg (2 mg/3 mL) pen injector pen injector   Indications: Class 1 obesity due to excess calories with serious comorbidity and body mass index (BMI) of 31.0 to 31.9 in adult, Type 2 diabetes mellitus without complication, without long-term current use of insulin (CMS/HCC) Inject 0.25 mg under the skin 1 (one) time every week. 3 mL  09/28/2023 12/27/2023     Ordered Prescriptions  - documented in this encounter  Reconcile with Patient's Chart  Ordered Prescriptions  Prescription Sig Dispense Quantity Refills Last Filled Start Date End Date   carvediloL (COREG) 3.125 mg tablet   Indications: Acute on chronic HFrEF (heart failure with reduced ejection fraction) (CMS/HCC) Take 1 tablet (3.125 mg total) by mouth 2 (two) times a day. 60 tablet      09/28/2023 10/28/2023   nitroGLYcerin (Nitrostat) 0.4 mg SL tablet   Indications: NSTEMI (non-ST elevated myocardial infarction) (CMS/HCC) Place 1 tablet (0.4 mg total) under the tongue every 5 (five) minutes as needed for chest pain for up to 30 doses. 30 tablet      09/28/2023     furosemide (LASIX) 40 mg tablet   Indications: Acute on chronic HFrEF (heart failure with reduced ejection fraction) (CMS/HCC) Take 1 tablet (40 mg total) by mouth daily. 30 tablet      09/28/2023 10/28/2023   ticagrelor (BRILINTA) 90 mg tablet   Indications: NSTEMI (non-ST elevated myocardial infarction) (CMS/HCC) Take 1 tablet (90 mg total) by mouth 2 (two) times a day. 60 tablet      09/28/2023 10/28/2023   atorvastatin (LIPITOR) 80 mg tablet   Indications: NSTEMI (non-ST elevated myocardial infarction) (CMS/HCC) Take 1 tablet (80 mg total) by mouth every evening. 90 tablet      09/28/2023 12/27/2023   aspirin 81 mg tablet   Indications: NSTEMI (non-ST elevated myocardial infarction) (CMS/HCC) Take 1 tablet (81 mg total) by mouth daily. 30 tablet      09/29/2023 10/29/2023   sacubitriL-valsartan (ENTRESTO) 24-26 mg tablet    Indications: Acute on chronic HFrEF (heart failure with reduced ejection fraction) (CMS/HCC) Take 1 tablet by mouth 2 (two) times a day. 60 tablet      09/28/2023 10/28/2023   metFORMIN (GLUCOPHAGE) 500 mg tablet   Indications: Type 2 diabetes mellitus without complication, without long-term current use of insulin (CMS/HCC) Take 1 tablet (500 mg total) by mouth 2 (two) times a day with meals. 180 tablet      09/28/2023 12/27/2023   empagliflozin (JARDIANCE) 10 mg tablet   Indications: Type 2 diabetes mellitus without complication, without long-term current use of insulin (CMS/HCC) Take 1 tablet (10 mg total) by mouth daily. 90 tablet      09/28/2023 12/27/2023   semaglutide (OZEMPIC) 0.25 mg or 0.5 mg (2 mg/3 mL) pen injector pen injector   Indications: Class 1 obesity due to excess calories with serious comorbidity and body mass index (BMI) of 31.0 to 31.9 in adult, Type 2 diabetes mellitus without complication, without long-term current use of insulin (CMS/HCC) Inject 0.25 mg under the skin 1 (one) time every week. 3 mL      09/28/2023 12/27/2023     Discharge Disposition  - documented in this encounter   Discharge Disposition  Disposition Code Departure Means Destination   Disch to Home (Routine)           H&P Notes  - documented in this encounter   Cyndia Diver, DO - 09/27/2023 9:51 PM PDT  Formatting of this note is different from the original.  Images from the original note were not included.      SHIP Hospitalist History and Physical    PCP: No primary care provider on file.    Subjective  CHIEF COMPLAINT    Chief Complaint  Patient presents with   Chest Pain  Chest pain with exertion since yesterday, radiates down to  the stomach. Denies SOB. Denies f/c/n/v.    HISTORY OF THE PRESENT ILLNESS  Matthew Estes is a 58 y.o. male with PMH of T2DM not on insulin, HTN, and HLD, who presents with chest pain with exertion x 1 day radiating to stomach, found to have NSTEMI on EKG and severe prox-mid LAD  stenosis s/p 2 DES, and severe prox LCX stenosis s/p 1 DES. Patient states that he came in with 30 minutes of substernal chest pressure. He also states that he is still feeling short of breath with exertion. Still having some substernal chest pressure. Denies any nausea and vomiting.    REVIEW OF SYSTEMS  A full review of systems was obtained and was negative except as listed above.    ED COURSE    Medications  lidocaine PF (XYLOCAINE) 10 mg/mL (1 %) injection 20 mg (has no administration in time range)  nitroGLYcerin (NITROSTAT) SL tablet 0.4 mg (has no administration in time range)  enoxaparin (LOVENOX) 40 mg/0.4 mL syringe 40 mg (has no administration in time range)  atorvastatin (LIPITOR) tablet 80 mg (80 mg oral Given 09/27/23 2025)  metoprolol tartrate (LOPRESSOR) tablet 25 mg (25 mg oral Given 09/27/23 2025)  aspirin EC tablet 81 mg (has no administration in time range)  ticagrelor (BRILINTA) tablet 90 mg (180 mg oral Given 09/27/23 1854)  acetaminophen (TYLENOL) tablet 650 mg (has no administration in time range)  Or  acetaminophen (TYLENOL) suppository 650 mg (has no administration in time range)  dextrose 5 % and sodium chloride 0.45 % (D5W 1/2 NS) infusion (has no administration in time range)  dextrose 10 % (D10W) infusion (has no administration in time range)  insulin lispro 100 unit/mL injection 2-12 Units (has no administration in time range)  insulin lispro 100 unit/mL injection 2-6 Units (has no administration in time range)  polyethylene glycol (MIRALAX) packet 1 packet (has no administration in time range)  ondansetron (PF) (ZOFRAN) injection 4 mg (has no administration in time range)  aspirin chewable tablet 324 mg (324 mg oral Given 09/27/23 1519)  heparin (porcine) 1,000 unit/mL injection 5,000 Units (5,000 Units intraVENOUS Given 09/27/23 1559)  sodium chloride 0.9 % bolus (1,000 mL intraVENOUS New Bag 09/27/23 1817)  sodium chloride 0.9 % bolus 500 mL (500 mL intraVENOUS New Bag 09/27/23  2025)    PAST MEDICAL HISTORY  I personally reviewed the patient's medical records that were available.    Past Medical History:  Diagnosis Date   Diabetes mellitus (CMS/HCC)   Hyperlipidemia   Hypertension    No past surgical history on file.    ALLERGIES  No Known Allergies    MEDICATIONS    Current Outpatient Medications  Medication Instructions   albuterol HFA (PROVENTIL HFA,VENTOLIN HFA,PROAIR HFA) 90 mcg/actuation inhaler 2 puffs, inhalation, Every 6 hours PRN   Januvia 100 mg, Daily   losartan (COZAAR) 50 mg, Daily   pioglitazone (ACTOS) 30 mg, Daily      FAMILY HISTORY    Family History  Problem Relation Age of Onset   Heart disease Father    SOCIAL HISTORY    Social History    Tobacco Use   Smoking status: Never   Smokeless tobacco: Never  Substance Use Topics   Drug use: Never      Social History    Social History Narrative   Not on file      Objective  PHYSICAL EXAM  Vital Signs:  Temp: [36.3 C (97.3 F)-36.7 C (98.1 F)] 36.3 C (97.3 F)  Heart Rate: [72-82] 82  Resp: [10-20] 19  BP: (111-150)/(71-93) 111/74  SpO2: 92 %  Admission Weight: 86.8 kg (191 lb 5.8 oz)  BMI (Calculated): 30.7    Patient Vitals for the past 24 hrs:  BP Temp Temp src Pulse Resp SpO2 Height Weight  09/27/23 1945 111/74 -- -- 82 -- 92 % -- --  09/27/23 1930 137/87 -- -- 76 -- 92 % -- --  09/27/23 1915 112/71 -- -- 78 -- 93 % 1.676 m (5' 5.98") 86.1 kg (189 lb 13.1 oz)  09/27/23 1900 125/77 36.3 C (97.3 F) Oral 78 19 92 % -- --  09/27/23 1715 146/88 -- -- 73 20 96 % -- --  09/27/23 1700 (!) 138/93 -- -- 73 13 98 % -- --  09/27/23 1645 140/88 -- -- 72 18 98 % -- --  09/27/23 1630 (!) 145/93 -- -- 75 18 100 % -- --  09/27/23 1615 150/87 -- -- 81 16 99 % -- --  09/27/23 1600 137/88 -- -- 74 12 98 % -- --  09/27/23 1545 141/88 -- -- 77 15 99 % -- --  09/27/23 1530 134/88 -- -- 73 10 97 % -- --  09/27/23 1515 143/89 -- -- 77 12 100 % -- --  09/27/23 1427 145/87 36.7 C (98.1 F) Oral 77 18 99 % 1.676 m (5\' 6" ) 86.8 kg (191 lb 5.8  oz)    Physical Exam: All vital signs reviewed and O2 saturation by my interpretation    Physical Exam  Vitals and nursing note reviewed.  Constitutional:  General: He is not in acute distress.  Appearance: Normal appearance. He is well-developed and normal weight. He is not diaphoretic.  HENT:  Head: Normocephalic and atraumatic.  Right Ear: External ear normal.  Left Ear: External ear normal.  Nose: Nose normal. No congestion.  Mouth/Throat:  Mouth: Mucous membranes are moist.  Pharynx: Oropharynx is clear. No oropharyngeal exudate.  Eyes:  General: No scleral icterus.  Right eye: No discharge.  Left eye: No discharge.  Extraocular Movements: Extraocular movements intact.  Conjunctiva/sclera: Conjunctivae normal.  Pupils: Pupils are equal, round, and reactive to light.  Cardiovascular:  Rate and Rhythm: Normal rate and regular rhythm.  Pulses: Normal pulses.  Heart sounds: Normal heart sounds. No murmur heard.  No friction rub. No gallop.  Pulmonary:  Effort: Pulmonary effort is normal. No respiratory distress.  Breath sounds: Normal breath sounds. No decreased breath sounds, wheezing, rhonchi or rales.  Chest:  Chest wall: No tenderness.  Abdominal:  General: Bowel sounds are normal. There is no distension.  Palpations: Abdomen is soft.  Tenderness: There is no abdominal tenderness.  Musculoskeletal:  Cervical back: Normal range of motion and neck supple.  Right lower leg: No edema.  Left lower leg: No edema.  Skin:  General: Skin is warm and dry.  Capillary Refill: Capillary refill takes less than 2 seconds.  Findings: No erythema or rash.  Neurological:  General: No focal deficit present.  Mental Status: He is alert and oriented to person, place, and time.  Cranial Nerves: Cranial nerves 2-12 are intact.  Sensory: Sensation is intact.  Motor: Motor function is intact.  Psychiatric:  Attention and Perception: Attention and perception normal.  Mood and Affect: Mood and affect normal.  Speech: Speech  normal.  Behavior: Behavior normal. Behavior is cooperative.  Thought Content: Thought content normal.  Cognition and Memory: Cognition and memory normal.  Judgment: Judgment normal.    LABORATORY STUDIES  Lab Results  Component Value Date/Time  WBC 9.2 09/27/2023 03:10 PM  RBC 4.12 (L) 09/27/2023 03:10 PM  HGB 12.3 (L) 09/27/2023 03:10 PM  HCT 36.1 (L) 09/27/2023 03:10 PM  MCV 88 09/27/2023 03:10 PM  MCH 30 09/27/2023 03:10 PM  MCHC 34.1 09/27/2023 03:10 PM  RDW 13.2 09/27/2023 03:10 PM  PLT 220 09/27/2023 03:10 PM  PRENEUTROABS 6.99 09/27/2023 03:10 PM  NEUTOPHILPCT 76.5 (H) 09/27/2023 03:10 PM  LYMPHOPCT 18.9 09/27/2023 03:10 PM  MONOPCT 3.9 (L) 09/27/2023 03:10 PM  EOSPCT 0.1 09/27/2023 03:10 PM  BASOPCT 0.4 09/27/2023 03:10 PM  NEUTROABS 6.99 09/27/2023 03:10 PM  LYMPHSABS 1.73 09/27/2023 03:10 PM  MONOSABS 0.36 09/27/2023 03:10 PM  EOSABS 0.01 09/27/2023 03:10 PM    Lab Results  Component Value Date/Time  NA 138 09/27/2023 03:10 PM  K 4.6 09/27/2023 03:10 PM  CL 107 09/27/2023 03:10 PM  CO2 28 09/27/2023 03:10 PM  GLUCOSE 166 (H) 09/27/2023 03:10 PM  BUN 25 (H) 09/27/2023 03:10 PM  CREATININE 0.90 09/27/2023 03:10 PM  OSMOLALITY 294 09/27/2023 03:10 PM  CALCIUM 9.0 09/27/2023 03:10 PM  ANIONGAP 3 (L) 09/27/2023 03:10 PM    EKG  Personally reviewed by me shows mild ST elevation in III, aVF, and V1 with ST depressions in I, aVL, V2-V5    IMAGING  X-ray portable chest single frontal view    Result Date: 09/27/2023  IMPRESSION: 1. Patchy retrocardiac opacification may be secondary to atelectasis/scarring. Developing infiltrate cannot be excluded. release CC Dr: Demetrius Charity: f:        Assessment/Plan  ASSESSMENT AND PLAN  Matthew Estes is a 58 y.o. male with PMH of T2DM not on insulin, HTN, and HLD, who presents with chest pain with exertion x 1 day radiating to stomach, found to have NSTEMI on EKG and severe prox-mid LAD stenosis s/p 2 DES, and severe prox LCX stenosis s/p 1 DES.    * NSTEMI (non-ST elevated myocardial  infarction) (CMS/HCC)  Assessment & Plan  EKG shows mild ST elevation in III, aVF, and V1, ST depressions in I, aVL, V2-V5. Trop 0.648  - Dr. Welton Flakes, cards, consulted  - s/p PCI on 3/28. Found to have severe stenosis of prox-mid LAD s/p 2 DES, and severe stenosis of prox LCx s/p 1 DES.  - ASA, Brilinta, and Lipitor 80mg   - Add metoprolol  - If pt's BP can tolerate, resume home losartan  - ECHO pending  - HgbA1c  - Lipid panel    Normocytic anemia  Assessment & Plan  H/H 12.3/36.1 with MCV 88. May be due to chronic disease. CTM    Hypertension  Assessment & Plan  Takes losartan at home. Resume tomorrow if BP can tolerate    Hyperlipidemia  Assessment & Plan  Was previously on Lipitor 10mg  but last dispense was 01/04/2023 for 90 days.  - Lipid panel  - Increase Lipitor to 80mg  daily    Type 2 diabetes mellitus (CMS/HCC)  Assessment & Plan  Takes Actos and Januvia at home. Would stop Actos on DC given risk of CHF.  - HgbA1c pending  - LISS    Disposition:  []  OBSERVATION STATUS  [x]  INPATIENT given severity and complexity of medical issues andanticipated hospital stay greater than 2 midnights    GOC:  Code Status: Full Code    Emergency Contacts    Contact Person (Rel.) Home Phone Work Buyer, retail  [place] (Daughter) -- -- [phone]          FEN:  Dietary Orders  (From admission, onward)          Start Ordered  09/27/23 2150 Combination Diet Heart Healthy; Carbohydrate Controlled (Diabetic); Standard Carbohydrate Controlled 60-75 gm Diet effective now  Question Answer Comment  Please Select Base Diet: Heart Healthy  Select up to 3 Modifiers: Carbohydrate Controlled (Diabetic)  Carbohydrate Level: Standard Carbohydrate Controlled 60-75 gm    09/27/23 2150                Quality Bundle:  DVT PPx: On Lovenox  Tele: NSTEMI  Foley: None  Central lines: None        Cyndia Diver, DO  [facility]      Electronically signed by Cyndia Diver, DO at 09/28/2023 4:06 AM PDT    Consult Notes  - documented in this encounter   Almon Register, MD - 09/27/2023 5:43 PM PDT  Formatting of this note is different from the original.  INTERVENTIONAL CARDIOLOGY CONSULTATION NOTE    PCP: No primary care provider on file.  REFERRING PROVIDER: No ref. provider found  CHIEF COMPLAINT: chest pain    History of Present Illness:  Matthew Estes is a 58 y.o. male who presents with chest pain.  He has HTN, DM2, HL at baseline.  Started having chest pressure and pain with exertion earlier today. Slowly progressed to pain with mild exertion. Resolved with resting.  No SOB. No f/c/s. No PND, orthopnea or leg swelling.    Past Medical History:  Patient Active Problem List  Diagnosis Date Noted  NSTEMI (non-ST elevated myocardial infarction) (CMS/HCC) 09/27/2023    Past Medical History:  Diagnosis Date  Diabetes mellitus (CMS/HCC)  Hyperlipidemia  Hypertension    No past surgical history on file.  Family History:  Reviewed    Social History:  Social History    Substance and Sexual Activity  Alcohol Use Not on file    Social History    Tobacco Use  Smoking Status Not on file  Smokeless Tobacco Not on file    Social History    Substance and Sexual Activity  Drug Use Not on file    Allergies:  No Known Allergies    Hospital Medications:  heparin, 0-30 Units/kg/hr, Last Rate: 11 Units/kg/hr (09/27/23 1601)      Review of Systems:  Complete review of systems negative aside from findings noted in the HPI.    Intake/Output:  No intake or output data in the 24 hours ending 09/27/23 1743  Physical Exam:  Vitals:  Vitals:  09/27/23 1427 09/27/23 1530 09/27/23 1700  BP: 145/87 134/88 (!) 138/93  Pulse: 77 73 73  Resp: 18 10 13   Temp: 36.7 C (98.1 F)  TempSrc: Oral  SpO2: 99% 97% 98%  Weight: 86.8 kg (191 lb 5.8 oz)  Height: 1.676 m (5\' 6" )    GEN: No acute distress, alert and oriented x 3  HEENT: NCAT, EOMI, clear O/P, clear ears and nares, no JVD, no carotid bruits  CHEST: Clear to auscultation bilaterally; no  rhonchi/rales/wheezes; normal respiratory effort  CV: Regular rate and rhythm; no murmurs, rubs, or gallops; normal PMI  ABD: Soft, nontender, nondistended, no hepatosplenomegaly; + Bowel Sounds x 4 quadrants  EXT: No edema, 2+ pulses in BL upper and lower extremities, no clubbing  SKIN: Warm and dry, no rashes or bruising  NEURO: Cranial nerves II-XII grossly intact, sensation grossly intact at all extremities. Moves all extremities to command.  MSK: Muscle strength 5/5 globally. No signs  of muscle wasting.  PSYCH: Normal though process and content. Normal mood.    Labs:  Results from last 7 days  Lab Units 09/27/23  1510  WBC K/mcL 9.2  HGB g/dL 16.1*  HEMATOCRIT % 09.6*  PLT K/mcL 220    Results from last 7 days  Lab Units 09/27/23  1510  SODIUM mmol/L 138  POTASSIUM mmol/L 4.6  CHLORIDE mmol/L 107  CO2 mmol/L 28  BUN mg/dL 25*  CREATININE mg/dL 0.45  GLUCOSE mg/dL 409*  CALCIUM mg/dL 9.0    Results from last 7 days  Lab Units 09/27/23  1510  PROTIME Seconds 11.1  INR 1.0    Results from last 7 days  Lab Units 09/27/23  1510  TROPONIN I ng/mL 0.648*      No results found for: "HGBA1C", "CHOLE", "HDL", "LDLCALC", "TRIG"  Imaging:  Study Results in the last 24 hours    Procedure . . . Date/Time  12-Lead ECG [[id number]] Collected: 09/27/23 1522  Order Status: Completed Updated: 09/27/23 1522  VENTRICULAR RATE EKG/MIN 85 bpm  ATRIAL RATE 85 bpm  PR-INTERVAL (MSEC) 142 ms  QRS-INTERVAL (MSEC) 80 ms  QT-INTERVAL (MSEC) 360 ms  QTC 428 ms  P AXIS 43 degrees  R AXIS -14 degrees  T AXIS 126 degrees  Impression:  Normal sinus rhythm  Marked ST abnormality, possible lateral subendocardial injury  Abnormal ECG  When compared with ECG of 27-Sep-2023 15:07, (Unconfirmed)  ST no longer elevated in Inferior leads  ST less depressed in Anterior leads  12-Lead ECG [[id number]] Collected: 09/27/23 1507  Order Status: Completed Updated: 09/27/23 1509  VENTRICULAR RATE EKG/MIN 86 bpm  ATRIAL RATE 86 bpm  PR-INTERVAL (MSEC) 138  ms  QRS-INTERVAL (MSEC) 78 ms  QT-INTERVAL (MSEC) 356 ms  QTC 426 ms  P AXIS 57 degrees  R AXIS -3 degrees  T AXIS 119 degrees  Impression:  ** Critical Test Result: STEMI  Normal sinus rhythm  Possible Left atrial enlargement  ST elevation, consider inferior injury or acute infarct  ** ** ACUTE MI / STEMI ** **  Consider right ventricular involvement in acute inferior infarct  Abnormal ECG  When compared with ECG of 27-Sep-2023 14:32, (Unconfirmed)  ST elevation now present in Inferior leads  ST now depressed in Anterior leads  X-ray portable chest single frontal view [[id number]] Collected: 09/27/23 1455  Order Status: Completed Updated: 09/27/23 1500  Narrative:  EXAMINATION: X-RAY PORTABLE CHEST SINGLE FRONTAL VIEW    DATE/TIME: 09/27/2023 2:52 PM    HISTORY: Chest Pain    COMPARISON: None.    FINDINGS:  Right shoulder surgical changes.    Normal cardiac size and configuration.    Normal pulmonary vasculature.    Reticular interstitial opacification with basilar predominance. Patchy retrocardiac opacification.Marland Kitchen    No pneumothorax.    Impression:  IMPRESSION:  1. Patchy retrocardiac opacification may be secondary to atelectasis/scarring. Developing infiltrate cannot be excluded.    release  CC Dr: Demetrius Charity: f:      12-Lead ECG [[id number]] Collected: 09/27/23 1432  Order Status: Completed Updated: 09/27/23 1433  VENTRICULAR RATE EKG/MIN 82 bpm  ATRIAL RATE 82 bpm  PR-INTERVAL (MSEC) 144 ms  QRS-INTERVAL (MSEC) 82 ms  QT-INTERVAL (MSEC) 362 ms  QTC 422 ms  P AXIS 57 degrees  R AXIS -17 degrees  T AXIS 132 degrees  Impression:  Normal sinus rhythm  Nonspecific ST and T wave abnormality  Abnormal ECG  No previous ECGs available  EKG:  12-Lead ECG    Result Date: 09/27/2023  Impression: Normal sinus rhythm Marked ST abnormality, possible lateral subendocardial injury Abnormal ECG When compared with ECG of 27-Sep-2023 15:07, (Unconfirmed) ST no longer elevated in Inferior leads ST less depressed in Anterior  leads    12-Lead ECG    Result Date: 09/27/2023  Impression: ** Critical Test Result: STEMI Normal sinus rhythm Possible Left atrial enlargement ST elevation, consider inferior injury or acute infarct ** ** ACUTE MI / STEMI ** ** Consider right ventricular involvement in acute inferior infarct Abnormal ECG When compared with ECG of 27-Sep-2023 14:32, (Unconfirmed) ST elevation now present in Inferior leads ST now depressed in Anterior leads    12-Lead ECG    Result Date: 09/27/2023  Impression: Normal sinus rhythm Nonspecific ST and T wave abnormality Abnormal ECG No previous ECGs available    Assessment/Plan  Chest pain  NSTEMI type 1 with EKG findings suspicious for inferior-posterior MI  HTN  HL  DM2  Obesity    Plan:  Heparin bolus  ASA  Statin  Echocardiogram  Urgent LHC/coronary angiogram advised given evolving EKG changes.    Electronically signed by Almon Register, MD September 27, 2023 5:43 PM.      Electronically signed by Almon Register, MD at 09/27/2023 5:45 PM PDT   Nursing Notes  - documented in this encounter   Waymond Cera, RN - 09/28/2023 4:22 PM PDT  Formatting of this note might be different from the original.  Discharge in. Medication education given by Recardo Evangelist, RN re-enforced information. Discharge instruction given to patient and pt's wife. Pt's wife able to speak [place]. They acknowledged understanding of medications and discharge information at bedside. PIV removed and no complication. Discharge package [facility]) and belongings are with patient. Nothing in skinny chart. No distress noted. Pt left by CNA, family picked up in front lobby.  Electronically signed by Waymond Cera, RN at 09/28/2023 4:57 PM PDT   ED Notes  - documented in this encounter   Elon Alas, MD - 09/27/2023 2:23 PM PDT  Formatting of this note is different from the original.    Emergency Department Record  [facility] [place]    Date of Service: 09/27/2023  Name: Matthew Estes  MRN: [id number]  CSN:  [id number]  DOB: Sep 29, 1965 Age: 58 y.o.    Chief Complaint  Patient presents with  Chest Pain  Chest pain with exertion since yesterday, radiates down to the stomach. Denies SOB. Denies f/c/n/v.    History of Present Illness:  Osten Janek is a 58 y.o. male presents to the emergency department chest pain. The patient describes chest pain and pressure with shortness of breath that has been occurring over the last 2 days with exertion. Patient works as a Naval architect, and he is reported several episodes where he actually has to stop and rest to catch his breath and the pain typically resolves. 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    The patient does have a history of diabetes hypertension hyperlipidemia.    Past Medical History:  Diagnosis Date  Hyperlipidemia  Hypertension  Type 2 diabetes mellitus (CMS/HCC)    No past surgical history on file.    Medications:  Current Outpatient Medications  None        Allergies:  No Known Allergies    Social History:  Holdyn Poyser    Family History:  Family history of diabetes.    Review of Systems:  All systems reviewed and negative other than as noted above    Vital signs:  Vitals:  09/27/23 1900 09/27/23 1915 09/27/23 1930 09/27/23 1945  BP: 125/77 112/71 137/87 111/74  BP Location: Left arm Left arm Left arm Left arm  Patient Position: Lying Lying Lying Lying  Pulse: 78 78 76 82  Resp: 19  Temp: 36.3 C (97.3 F)  TempSrc: Oral  SpO2: 92% 93% 92% 92%  Weight: 86.1 kg (189 lb 13.1 oz)  Height: 1.676 m (5' 5.98")    Physical exam:    Patient is well-developed, well nourished & non-toxic appearing    HEENT: Head is atraumatic, normocephalic. Eyes are PERRL. OP is clear. Neck is supple without meningismus  Chest: Clear to auscultation bilaterally without  wheezes or rhonchi  Car: Regular rate and rhythm without murmurs or rubs  Abd: Soft, non-distended, non-tender, normoactive bowel sounds  Ext: No cyanosis, clubbing or edema. No cords appreciated. No calf tenderness.  Neuro: A&O x4, Cranial nerves II through XII intact. Strength 5 out of 5 throughout. Sensory grossly intact to light touch throughout.  Skin: Good color and turgor. No petechiae.    Labs:  Recent Results (from the past 24 hours)  12-Lead ECG  Collection Time: 09/27/23 2:32 PM  Result Value Ref Range  VENTRICULAR RATE EKG/MIN 82 bpm  ATRIAL RATE 82 bpm  PR-INTERVAL (MSEC) 144 ms  QRS-INTERVAL (MSEC) 82 ms  QT-INTERVAL (MSEC) 362 ms  QTC 422 ms  P AXIS 57 degrees  R AXIS -17 degrees  T AXIS 132 degrees  12-Lead ECG  Collection Time: 09/27/23 3:07 PM  Result Value Ref Range  VENTRICULAR RATE EKG/MIN 86 bpm  ATRIAL RATE 86 bpm  PR-INTERVAL (MSEC) 138 ms  QRS-INTERVAL (MSEC) 78 ms  QT-INTERVAL (MSEC) 356 ms  QTC 426 ms  P AXIS 57 degrees  R AXIS -3 degrees  T AXIS 119 degrees  Basic metabolic panel  Collection Time: 09/27/23 3:10 PM  Result Value Ref Range  Fasting?  Glucose 166 (H) 70 - 125 mg/dL  Sodium 119 147 - 829 mmol/L  Potassium 4.6 3.5 - 5.1 mmol/L  Chloride 107 98 - 107 mmol/L  CO2, Total 28 22 - 30 mmol/L  Urea Nitrogen 25 (H) 9 - 20 mg/dL  Creatinine 5.62 1.30 - 1.30 mg/dL  CKD-EPI eGFR 8657 [id number] >60 mL/min/1.71m*2  Anion Gap 3 (L) 6 - 14 mmol/L  Osmolality Calculated 294 280 - 305 mOsm/kg H20  Calcium 9.0 8.4 - 10.2 mg/dL  Magnesium  Collection Time: 09/27/23 3:10 PM  Result Value Ref Range  Magnesium 1.9 1.6 - 2.3 mg/dL  Prothrombin time-INR  Collection Time: 09/27/23 3:10 PM  Result Value Ref Range  Protime 11.1 10.0 - 13.1 Seconds  INR 1.0 Therapeutic Range: 2.0 - 3.0 Conventional Anticoagulation; 2.5 - 3.5 Intensive Anticoagulation  Activated PTT  Collection Time: 09/27/23 3:10 PM  Result Value Ref Range  aPTT 32 Reference Range is 26-38. Heparin Therapeutic range is 53-87 Seconds  Troponin  I  Collection Time: 09/27/23 3:10 PM  Result Value Ref Range  Troponin I 0.648 (CH) <=0.034 ng/mL  CBC with Differential  Collection Time: 09/27/23 3:10 PM  Result Value Ref Range  WBC 9.2 3.4 - 11.0 K/mcL  RBC 4.12 (L) 4.46 - 5.85  M/mcL  HGB 12.3 (L) 13.0 - 17.1 g/dL  HCT 16.1 (L) 09.6 - 04.5 %  MCV 88 81 - 100 fL  MCH 30 26 - 33 pg  MCHC 34.1 31.0 - 36.0 g/dL  RDW 40.9 81.1 - 91.4 %  Platelet Count 220 150 - 425 K/mcL  MPV 10.7 9.0 - 12.8 fL  nRBC 0.0 0.0 /100 WBCs  Neutrophils % 76.5 (H) 38.0 - 74.0 %  IG% 0.2 0.0 - 1.2 %  Lymphocytes % 18.9 16.0 - 48.0 %  Monocytes % 3.9 (L) 4.9 - 12.5 %  Eosinophils % 0.1 0.0 - 9.5 %  Basophils % 0.4 0.0 - 1.6 %  Absolute Neutrophils 6.99 1.50 - 7.40 K/mcL  IG# 0.02 0.00 - 0.10 K/mcL  Absolute Lymphocytes 1.73 0.90 - 3.10 K/mcL  Absolute Monocytes 0.36 0.26 - 0.87 K/mcL  Absolute Eosinophils 0.01 0.00 - 0.51 K/mcL  Absolute Basophils 0.04 0.00 - 0.09 K/mcL  Preliminary Abs Neut 6.99 1.50 - 7.40 K/mcL  12-Lead ECG  Collection Time: 09/27/23 3:22 PM  Result Value Ref Range  VENTRICULAR RATE EKG/MIN 85 bpm  ATRIAL RATE 85 bpm  PR-INTERVAL (MSEC) 142 ms  QRS-INTERVAL (MSEC) 80 ms  QT-INTERVAL (MSEC) 360 ms  QTC 428 ms  P AXIS 43 degrees  R AXIS -14 degrees  T AXIS 126 degrees  Glucose,POC  Collection Time: 09/27/23 10:22 PM  Result Value Ref Range  Glucose Blood, POC 201 (H) 70 - 125 mg/dL  Operator ID [id number]    Imaging:  X-ray portable chest single frontal view    Result Date: 09/27/2023  IMPRESSION: 1. Patchy retrocardiac opacification may be secondary to atelectasis/scarring. Developing infiltrate cannot be excluded. release CC Dr: Demetrius Charity: f:  Cardiac Catheterization  Final Result    X-ray portable chest single frontal view  Final Result  IMPRESSION:  1. Patchy retrocardiac opacification may be secondary to atelectasis/scarring. Developing infiltrate cannot be excluded.    release  CC Dr: Demetrius Charity: f:        Echocardiogram 2D (with image enhancement agent color flow map/spectral  analysis/strain/3D prn) (Results Pending)    ED Course & Medical Decision Making:  ED Course as of 09/27/23 2240  Fri Sep 27, 2023  1502 12-Lead ECG  Sinus rhythm noted with a ventricular rate of 82. The patient has one-to-one conduction. There is no ST segment elevation noted. The patient does have damage sloping ST segments in 1 aVL lead II, and I millimeter ST segment depressions noted in V4 V5 and V6. This could be concerning for lateral strain or lateral ischemia. Patient has a very subtle ST segment elevation less than 1 mm in aVR only. Otherwise no other ectopy aberrancy or distinct T waves inversions are noted. No old tracings are available for comparison. This is nonspecific. [MH]  1520 12-Lead ECG  EKG demonstrates more prominent ST segment elevation in aVR V1, with downward sloping of the ST segments in 1 aVL and in the anteroseptal leads.Probably consistent with inferior infarct lateral reciprocal changes and possible right ventricular involvement with elevations in aVR and V1. Patient remains asymptomatic currently, consulting cardiology, repeat EKG. Should the repeat demonstrate similar findings, STEMI will be called. [MH]  1523 12-Lead ECG  This EKG #3 demonstrates sinus rhythm once again, and the ST segments are not clearly apparent in the inferior leads. The patient continues to have mild elevations in aVR V1, and the lateral strain pattern is much less prominent than on previous EKG. Patient remains asymptomatic. Ventricular rate  is 85. Aspirin has been given. Consulting on-call cardiology. [MH]  1524 Overall it appears that the patient is having waxing and waning symptoms with EKG changes to correlate. Certainly inferior ischemia and RV involvement is considered for the patient. [MH]    ED Course User Index  [MH] Elon Alas, MD    Critical Care Time:  40 minutes of critical care time was spent on this patient in excess of other billable procedures. This patient's clinical presentation  required my highest level of preparedness to intervene urgently and emergently. Critical care services were provided in the following manner, by chart and data review, documentation time, discussion of care with other providers, and in the ordering and management of the patient's clinical, laboratory and radiographic studies and interventions. The patient required multiple reassessments at the bedside, as well as responses to the aforementioned therapies. This case represents a high level of medical decision making and the diagnosis carries a significant morbidity and mortality.    External Notes Reviewed: Noted. Limited.    Independent Historian: Adequate historian.    Differential Diagnosis: This is a 58 year old male with risk factors for coronary artery disease presenting to the emergency department with exertional chest pain. The patient did demonstrate some EKG findings that were concerning for acute ST segment elevations potentially inferiorly with possible right-sided findings as well. The patient had no chest pain had resolution of the symptoms and treatment was instituted to did with cardiology consultation placed. The patient was taken from the emergency department to the Cath Lab by cardiology at the time of his evaluation here in the emergency department. Thanks to their team for their assistance in the care of this patient.    Patient's care may be negatively impacted by access to care. Based on these, patient will have barriers to prompt primary specialist care follow.    Clinical Impression:  Acute unstable angina moderate to severe  Concern for non-ST segment elevation MI  Acute waxing waning EKG changes exertional  Acute elevation troponin mild to moderate  Acute hyperglycemia mild    Disposition:  The patient was taken to Cath Lab admitted subsequently.     Imaging Results - X-ray portable chest single frontal view STAT for SOB (09/28/2023 12:56 AM PDT)  Narrative   09/28/2023 9:49 AM PDT    EXAMINATION:  X-RAY PORTABLE CHEST SINGLE FRONTAL VIEW    DATE/TIME:  09/28/2023 12:56 AM    HISTORY: Shortness of breath.    COMPARISON: 09/27/2023    FINDINGS:      Worsening pulmonary venous congestion. No large pleural effusion or pneumothorax. Cardiac silhouette is unchanged.        Assessment/Plan:  Assessment & Plan  History of non-ST elevation myocardial infarction (NSTEMI)    Orders:    ticagrelor (BRILINTA) 90 MG TABS tablet; Take 1 tablet (90 mg) by mouth every 12 hours.    aspirin 81 MG EC tablet; Take 1 tablet (81 mg) by mouth daily.    Consult/Referral to Cardiology Clinic  Stable, stat referral to cardio made, continue ASA and brilinta. Follow up with cardiology, ER precautions reviewed.   History of coronary artery stent placement    Orders:    ticagrelor (BRILINTA) 90 MG TABS tablet; Take 1 tablet (90 mg) by mouth every 12 hours.    aspirin 81 MG EC tablet; Take 1 tablet (81 mg) by mouth daily.    Consult/Referral to Cardiology Clinic  Stable, stat referral to cardio made, continue ASA and brilinta. Follow up  with cardiology, ER precautions reviewed.   Acute on chronic HFrEF (heart failure with reduced ejection fraction) (CMS-HCC)    Orders:    Consult/Referral to Cardiology Clinic  Continue lasix, salt restriction reviewed. Referral to cardio made.   Fatigue, unspecified type       Watch your fatigue for any changes.  Go to bed and get up at the same time every day.  Avoid fatigue by pacing yourself during the day and getting enough sleep at night.  Maintain a healthy weight.  Medicines  Take over-the-counter and prescription medicines only as told by your health care provider.  Take a multivitamin, if told by your health care provider.   Do not use herbal or dietary supplements unless they are approved by your health care provider.  Activity  Exercise regularly, as told by your health care provider.  Use or practice techniques to help you relax, such as yoga, tai chi, meditation, or massage  therapy.  Eating and drinking  Avoid heavy meals in the evening.  Eat a well-balanced diet, which includes lean proteins, whole grains, plenty of fruits and vegetables, and low-fat dairy products.  Avoid consuming too much caffeine.  Avoid the use of alcohol.  Drink enough fluid to keep your urine pale yellow.  Lifestyle  Change situations that cause you stress. Try to keep your work and personal schedule in balance.  Do not use any products that contain nicotine or tobacco, such as cigarettes and e-cigarettes. If you need help quitting, ask your health care provider.  Do not use drugs.  Contact a health care provider if:  Your fatigue does not get better.  You have a fever.  You suddenly lose or gain weight.  You have headaches.  You have trouble falling asleep or sleeping through the night.  You feel angry, guilty, anxious, or sad.  You are unable to have a bowel movement (constipation).  Your skin is dry.  You have swelling in your legs or another part of your body.  Get help right away if:  You feel confused.  Your vision is blurry.  You feel faint or you pass out.  You have a severe headache.  You have severe pain in your abdomen, your back, or the area between your waist and hips (pelvis).  You have chest pain, shortness of breath, or an irregular or fast heartbeat.  You are unable to urinate, or you urinate less than normal.  You have abnormal bleeding, such as bleeding from the rectum, vagina, nose, lungs, or nipples.  You vomit blood.  You have thoughts about hurting yourself or others.    Nutrition and healthy food. Exercise. low fat diet. high fiber diet. Take medications as directed. Emergency room instructions were given.  Follow up in 14 days.

## 2023-10-01 NOTE — Addendum Note (Signed)
 Addended by: Sandre Kitty on: 10/01/2023 05:01 PM     Modules accepted: Orders, Level of Service

## 2023-10-01 NOTE — Addendum Note (Signed)
 Addended by: Sandre Kitty on: 10/01/2023 01:22 PM     Modules accepted: Level of Service

## 2023-10-09 ENCOUNTER — Telehealth (INDEPENDENT_AMBULATORY_CARE_PROVIDER_SITE_OTHER): Payer: Self-pay | Admitting: Emergency Medicine

## 2023-10-09 ENCOUNTER — Encounter (INDEPENDENT_AMBULATORY_CARE_PROVIDER_SITE_OTHER): Payer: Self-pay | Admitting: Emergency Medicine

## 2023-10-09 DIAGNOSIS — Z955 Presence of coronary angioplasty implant and graft: Secondary | ICD-10-CM

## 2023-10-09 DIAGNOSIS — I5023 Acute on chronic systolic (congestive) heart failure: Secondary | ICD-10-CM

## 2023-10-09 DIAGNOSIS — I252 Old myocardial infarction: Secondary | ICD-10-CM

## 2023-10-09 LAB — HM DIABETES EYE EXAM

## 2023-10-09 NOTE — Progress Notes (Unsigned)
EDD

## 2023-10-22 ENCOUNTER — Ambulatory Visit (INDEPENDENT_AMBULATORY_CARE_PROVIDER_SITE_OTHER): Admitting: Emergency Medicine

## 2023-10-23 ENCOUNTER — Encounter: Payer: 59 | Admitting: Internal Medicine

## 2023-10-29 ENCOUNTER — Telehealth: Payer: Self-pay

## 2023-10-29 NOTE — Telephone Encounter (Signed)
 Copied from CRM [id number]. Topic: General - Other >> Oct 29, 2023  2:41 PM Star [place] wrote: Reason for CRM: patient asking if needs to fast for tomorrow's physical, please call or test to inform  [phone]

## 2023-10-29 NOTE — Telephone Encounter (Signed)
 Spoke w/ Pt- informed to eat breakfast, skip lunch for appt. Pt verbalized understanding.

## 2023-10-30 ENCOUNTER — Encounter: Payer: Self-pay | Admitting: Internal Medicine

## 2023-10-30 ENCOUNTER — Ambulatory Visit (INDEPENDENT_AMBULATORY_CARE_PROVIDER_SITE_OTHER): Payer: 59 | Admitting: Internal Medicine

## 2023-10-30 VITALS — BP 122/86 | HR 58 | Temp 97.9°F | Resp 16 | Ht 68.0 in | Wt 234.5 lb

## 2023-10-30 DIAGNOSIS — E119 Type 2 diabetes mellitus without complications: Secondary | ICD-10-CM

## 2023-10-30 DIAGNOSIS — Z7984 Long term (current) use of oral hypoglycemic drugs: Secondary | ICD-10-CM

## 2023-10-30 DIAGNOSIS — I10 Essential (primary) hypertension: Secondary | ICD-10-CM

## 2023-10-30 DIAGNOSIS — D179 Benign lipomatous neoplasm, unspecified: Secondary | ICD-10-CM | POA: Diagnosis not present

## 2023-10-30 DIAGNOSIS — R0602 Shortness of breath: Secondary | ICD-10-CM

## 2023-10-30 DIAGNOSIS — Z0001 Encounter for general adult medical examination with abnormal findings: Secondary | ICD-10-CM

## 2023-10-30 DIAGNOSIS — Z23 Encounter for immunization: Secondary | ICD-10-CM

## 2023-10-30 DIAGNOSIS — Z0000 Encounter for general adult medical examination without abnormal findings: Secondary | ICD-10-CM

## 2023-10-30 DIAGNOSIS — E785 Hyperlipidemia, unspecified: Secondary | ICD-10-CM | POA: Diagnosis not present

## 2023-10-30 DIAGNOSIS — Z125 Encounter for screening for malignant neoplasm of prostate: Secondary | ICD-10-CM | POA: Diagnosis not present

## 2023-10-30 NOTE — Patient Instructions (Addendum)
 INSTRUCTIONS  FOR TODAY  Continue taking your medication as you are doing  Check the  blood pressure regularly Blood pressure goal:  between 110/65 and  135/85. If it is consistently higher or lower, let me know     GO TO THE LAB : Get the blood work     Next office visit for a checkup in 6 months Please make an appointment before you leave today     Per our records you are due for your diabetic eye exam. Please contact your eye doctor to schedule an appointment. Please have them send copies of your office visit notes to us . Our fax number is [phone]. If you need a referral to an eye doctor please let us  know.

## 2023-10-30 NOTE — Progress Notes (Signed)
 Subjective:    Patient ID: Bryan Shaw, male    DOB: 11/19/1965, 58 y.o.   MRN: [id number]  DOS:  10/30/2023 Type of visit - description: CPX  Here for CPX Chronic medical problems addressed. Medication reconciliation done Still concerned about subcu nodules. Has random episode of mild SOB, no associated chest pain, cough, nausea.  At rest or when he is at work.    Review of Systems  Other than above, a 14 point review of systems is negative    Past Medical History:  Diagnosis Date   Angio-edema    Diabetes mellitus, type 2 (HCC)    on meds   Essential hypertension 12/31/2019   on meds   GERD (gastroesophageal reflux disease)    on meds   Hyperlipidemia    on meds   Positive PPD 1999   INH x 6 months   PPD negative 2001   Urticaria     Past Surgical History:  Procedure Laterality Date   COLONOSCOPY  2019   HD-MAC-Plenvu(exc)-hems/fecal matter   Social History   Socioeconomic History   Marital status: Married    Spouse name: Not on file   Number of children: 1   Years of education: Not on file   Highest education level: Not on file  Occupational History   Occupation: Control and instrumentation engineer  [employer]  Tobacco Use   Smoking status: Never   Smokeless tobacco: Never  Vaping Use   Vaping status: Never Used  Substance and Sexual Activity   Alcohol use: No   Drug use: Not Currently   Sexual activity: Yes    Birth control/protection: None  Other Topics Concern   Not on file  Social History Narrative   From [place], wife from [place]    1 adopted son, adult   Household: pt and wife    Social Drivers of Corporate investment banker Strain: Not on Ship broker Insecurity: Not on file  Transportation Needs: Not on file  Physical Activity: Not on file  Stress: Not on file  Social Connections: Not on file  Intimate Partner Violence: Not on file    Current Outpatient Medications  Medication Instructions   amLODipine  (NORVASC ) 5 mg, Oral, Daily   atorvastatin   (LIPITOR) 20 mg, Oral, Daily at bedtime   cetirizine  (ZYRTEC ) 10 mg, Oral, 2 times daily   metFORMIN  (GLUCOPHAGE ) 850 mg, Oral, 2 times daily with meals   omeprazole  (PRILOSEC) 20 mg, Oral, Daily       Objective:   Physical Exam Skin:        BP 122/86   Pulse (!) 58   Temp 97.9 F (36.6 C) (Oral)   Resp 16   Ht 5\' 8"  (1.727 m)   Wt 234 lb 8 oz (106.4 kg)   SpO2 97%   BMI 35.66 kg/m  General: Well developed, NAD, BMI noted Neck: No  thyromegaly  HEENT:  Normocephalic . Face symmetric, atraumatic Lungs:  CTA B Normal respiratory effort, no intercostal retractions, no accessory muscle use. Heart: RRR,  no murmur.  Abdomen:  Not distended, soft, non-tender. No rebound or rigidity.   DM foot exam no edema, pelvic examination normal, vascular exam normal Skin: See graphic, has few subcutaneous indurations, not attached to the skin or deeper structures.  Skin normal. Neurologic:  alert & oriented X3.  Speech normal, gait appropriate for age and unassisted Strength symmetric and appropriate for age.  Psych: Cognition and judgment appear intact.  Cooperative with normal attention span  and concentration.  Behavior appropriate. No anxious or depressed appearing.     Assessment     Assessment DM Dx/2022 HTN (started med 10/2019) High chol GERD Multiple lipomas Angioedema (allergist eval 2023, blood test neg, sxs self resolved, no clear etiology, on meds prn) PPD +1999, s/p INH. PPD (-) 2001  PLAN:  Here for CPX -Tdap 2019 - s/p shingrix  x2 - PNM 20 today - Vaccines I advised: Flu shot every fall,COVID booster is an option.  -CCS: Colonoscopy 5/ 2019, C-scope 02/28/2023, next per GI -prostate ca screening: No symptoms,   checking PSA -Diet-exercise: Encouraged to exercise more, diet discussed. - Labs:  CMP FLP CBC A1c micro TSH PSA Other issues addressed today: DM: Good compliance with metformin , check A1c and micro, feet exam negative today. HTN: Patient  decided to stop HCTZ, no particular reason.  Taking amlodipine  QOD, reportedly BPs well-controlled.  Evidently this is working for him, no change. EKG today: Sinus bradycardia, essentially normal. High cholesterol: On atorvastatin  20 mg, decided to take QOD, checking FLP. SQ nodules: Concerned about the issue.  They have been present for few years in the abdomen, they have no change in size, has a couple at the arms, they appear benign on exam.  Surgical referral if he desires excision. SOB: Random sxs s, see HPI, observation.  Call if symptoms increase RTC 6 months

## 2023-10-31 ENCOUNTER — Encounter: Payer: Self-pay | Admitting: Internal Medicine

## 2023-10-31 DIAGNOSIS — E785 Hyperlipidemia, unspecified: Secondary | ICD-10-CM | POA: Insufficient documentation

## 2023-10-31 LAB — COMPREHENSIVE METABOLIC PANEL WITH GFR
ALT: 39 U/L (ref 0–53)
AST: 25 U/L (ref 0–37)
Albumin: 4.6 g/dL (ref 3.5–5.2)
Alkaline Phosphatase: 92 U/L (ref 39–117)
BUN: 15 mg/dL (ref 6–23)
CO2: 27 meq/L (ref 19–32)
Calcium: 10 mg/dL (ref 8.4–10.5)
Chloride: 104 meq/L (ref 96–112)
Creatinine, Ser: 1.03 mg/dL (ref 0.40–1.50)
GFR: 81.36 mL/min (ref >60.00)
Glucose, Bld: 70 mg/dL (ref 70–99)
Potassium: 4.2 meq/L (ref 3.5–5.1)
Sodium: 139 meq/L (ref 135–145)
Total Bilirubin: 0.5 mg/dL (ref 0.2–1.2)
Total Protein: 7.8 g/dL (ref 6.0–8.3)

## 2023-10-31 LAB — CBC WITH DIFFERENTIAL/PLATELET
Basophils Absolute: 0 10*3/uL (ref 0.0–0.1)
Basophils Relative: 0.5 % (ref 0.0–3.0)
Eosinophils Absolute: 0.3 10*3/uL (ref 0.0–0.7)
Eosinophils Relative: 3.5 % (ref 0.0–5.0)
HCT: 49.7 % (ref 39.0–52.0)
Hemoglobin: 16.2 g/dL (ref 13.0–17.0)
Lymphocytes Relative: 40.8 % (ref 12.0–46.0)
Lymphs Abs: 3.6 10*3/uL (ref 0.7–4.0)
MCHC: 32.7 g/dL (ref 30.0–36.0)
MCV: 85.6 fl (ref 78.0–100.0)
Monocytes Absolute: 0.8 10*3/uL (ref 0.1–1.0)
Monocytes Relative: 9.1 % (ref 3.0–12.0)
Neutro Abs: 4.1 10*3/uL (ref 1.4–7.7)
Neutrophils Relative %: 46.1 % (ref 43.0–77.0)
Platelets: 202 10*3/uL (ref 150.0–400.0)
RBC: 5.8 Mil/uL (ref 4.22–5.81)
RDW: 14.4 % (ref 11.5–15.5)
WBC: 9 10*3/uL (ref 4.0–10.5)

## 2023-10-31 LAB — LIPID PANEL
Cholesterol: 172 mg/dL (ref 0–200)
HDL: 40.1 mg/dL (ref >39.00)
LDL Cholesterol: 81 mg/dL (ref 0–99)
NonHDL: 131.99
Total CHOL/HDL Ratio: 4
Triglycerides: 255 mg/dL — ABNORMAL HIGH (ref 0.0–149.0)
VLDL: 51 mg/dL — ABNORMAL HIGH (ref 0.0–40.0)

## 2023-10-31 LAB — MICROALBUMIN / CREATININE URINE RATIO
Creatinine,U: 163.4 mg/dL
Microalb Creat Ratio: 9.5 mg/g (ref 0.0–30.0)
Microalb, Ur: 1.6 mg/dL (ref 0.0–1.9)

## 2023-10-31 LAB — TSH: TSH: 3.81 u[IU]/mL (ref 0.35–5.50)

## 2023-10-31 LAB — HEMOGLOBIN A1C: Hgb A1c MFr Bld: 6.6 % — ABNORMAL HIGH (ref 4.6–6.5)

## 2023-10-31 LAB — PSA: PSA: 0.51 ng/mL (ref 0.10–4.00)

## 2023-10-31 NOTE — Assessment & Plan Note (Signed)
 Here for CPX -Tdap 2019 - s/p shingrix  x2 - PNM 20 today - Vaccines I advised: Flu shot every fall,COVID booster is an option.  -CCS: Colonoscopy 5/ 2019, C-scope 02/28/2023, next per GI -prostate ca screening: No symptoms,   checking PSA -Diet-exercise: Encouraged to exercise more, diet discussed. - Labs:  CMP FLP CBC A1c micro TSH PSA

## 2023-10-31 NOTE — Assessment & Plan Note (Signed)
 Here for CPX  Other issues addressed today: DM: Good compliance with metformin , check A1c and micro, feet exam negative today. HTN: Patient decided to stop HCTZ, no particular reason.  Taking amlodipine  QOD, reportedly BPs well-controlled.  Evidently this is working for him, no change. EKG today: Sinus bradycardia, essentially normal. High cholesterol: On atorvastatin  20 mg, decided to take QOD, checking FLP. SQ nodules: Concerned about the issue.  They have been present for few years in the abdomen, they have no change in size, has a couple at the arms, they appear benign on exam.  Surgical referral if he desires excision. SOB: Random sxs s, see HPI, observation.  Call if symptoms increase RTC 6 months

## 2023-11-01 ENCOUNTER — Encounter: Payer: Self-pay | Admitting: Internal Medicine

## 2023-11-01 ENCOUNTER — Telehealth: Payer: Self-pay | Admitting: Internal Medicine

## 2023-11-01 MED ORDER — METFORMIN HCL 850 MG PO TABS: 850.0000 mg | ORAL_TABLET | Freq: Two times a day (BID) | ORAL | 1 refills | Status: DC

## 2023-11-01 NOTE — Telephone Encounter (Signed)
 Refill request for atorvastatin  20mg , labs done on 10/30/23, okay to refill at this dose?

## 2023-11-01 NOTE — Telephone Encounter (Signed)
 No changes per PCP, refills sent.

## 2023-11-21 ENCOUNTER — Telehealth (INDEPENDENT_AMBULATORY_CARE_PROVIDER_SITE_OTHER): Payer: Self-pay | Admitting: Emergency Medicine

## 2023-11-21 DIAGNOSIS — I252 Old myocardial infarction: Secondary | ICD-10-CM

## 2023-11-21 DIAGNOSIS — Z955 Presence of coronary angioplasty implant and graft: Secondary | ICD-10-CM

## 2023-11-28 ENCOUNTER — Encounter (INDEPENDENT_AMBULATORY_CARE_PROVIDER_SITE_OTHER): Payer: Self-pay | Admitting: Emergency Medicine

## 2023-12-03 ENCOUNTER — Ambulatory Visit (INDEPENDENT_AMBULATORY_CARE_PROVIDER_SITE_OTHER): Admitting: Emergency Medicine

## 2023-12-03 VITALS — BP 143/68 | HR 80 | Temp 96.8°F | Resp 16 | Ht 68.0 in | Wt 178.0 lb

## 2023-12-03 MED ORDER — CARVEDILOL 3.125 MG OR TABS
3.1250 mg | ORAL_TABLET | Freq: Two times a day (BID) | ORAL | 0 refills | Status: DC
Start: 2023-12-03 — End: 2024-03-11

## 2023-12-03 NOTE — Progress Notes (Signed)
 Subjective :  Matthew Estes is a 58 year old male who is here for Follow Up (58 yo, male come to the clinic for Follow up,by: E Tamea MA.)      HPI    Issues discussed today:  Follow up  Unable to resume work   Review of Systems   Review of Systems   Constitutional: Negative for activity change, appetite change, fatigue, fever and unexpected weight change.   HENT: Negative for congestion, facial swelling, hearing loss, rhinorrhea, sinus pressure, sore throat, trouble swallowing and voice change.    Eyes: Negative for photophobia, pain, discharge, redness, itching and visual disturbance.   Respiratory: Negative for apnea, cough, choking, chest tightness, shortness of breath, wheezing and stridor.    Cardiovascular: Negative for chest pain, palpitations and leg swelling.   Gastrointestinal: Negative for abdominal distention, abdominal pain, anal bleeding, blood in stool, constipation, diarrhea, nausea, rectal pain and vomiting.   Endocrine: Negative for cold intolerance, heat intolerance, polydipsia, polyphagia and polyuria.   Genitourinary: Negative for difficulty urinating, dysuria, enuresis, flank pain, frequency, genital sores and hematuria.   Musculoskeletal: Negative for arthralgias, back pain, gait problem, joint swelling, myalgias, neck pain and neck stiffness.   Skin: Negative for pallor, rash and wound.   Allergic/Immunologic: Negative for environmental allergies, food allergies and immunocompromised state.   Neurological: Negative for dizziness, tremors, seizures, syncope, facial asymmetry, speech difficulty, weakness, light-headedness, numbness and headaches.   Hematological: Negative for adenopathy. Does not bruise/bleed easily.   Psychiatric/Behavioral: Negative for agitation, behavioral problems, confusion, decreased concentration, dysphoric mood, hallucinations, self-injury, sleep disturbance and suicidal ideas. The patient is not nervous/anxious and is not hyperactive.       albuterol  Inhale 2  puffs by mouth every 6 hours as needed for Wheezing. 8 g 2    aspirin  Take 1 tablet (81 mg) by mouth daily. 100 tablet 3    atorvastatin  Take 1 tablet (40 mg) by mouth daily for 30 days. 30 tablet 5    blood glucose PHARMACY: Please dispense insurance approved bluetooth enable meter. 1 each 0    empagliflozin Take 1 tablet (10 mg) by mouth.      True Metrix Blood Glucose Test 1 strip by Other route every morning (before breakfast). 100 strip 3    glucose blood 1 strip by Other route every morning (before breakfast). 100 strip 11    lancets 1 each by Other route every morning (before breakfast). 100 Lancet. 11    metFORMIN  TAKE 1 TABLET BY MOUTH TWICE A DAY WITH MEALS 180 tablet 3    nitroGLYcerin 1 tablet (0.4 mg) by Sublingual route.      omega-3 acid ethyl esters TAKE TWO CAPSULE BY MOUTH TWICE DAILY FOR CHOLESTEROL      pioglitazone  Take 1 tablet (30 mg) by mouth daily. 90 tablet 3    semaglutide  (0.25 or 0.5MG /DOSE) Inject 0.25 mg under the skin every 7 days.      Januvia  Take 1 tablet (100 mg) by mouth daily. 90 tablet 3    ticagrelor  Take 1 tablet (90 mg) by mouth every 12 hours. 180 tablet 3     No Known Allergies    Reviewed patients pertinent information related to social history, past medical, past surgical, and family history.     Objective :  Vital signs: BP 143/68 (BP Location: Left arm, BP Patient Position: Sitting, BP cuff size: Regular)   Pulse 80   Temp 96.8 F (36 C) (Temporal)   Resp 16  Ht 5' 8 (1.727 m)   Wt 80.7 kg (178 lb)   SpO2 99%   BMI 27.06 kg/m     Physical Exam   Physical Exam  Constitutional:       General: not in acute distress.     Appearance: Normal appearance.  is normal weight. is not ill-appearing, toxic-appearing or diaphoretic.   HENT:      Head: Normocephalic and atraumatic.   Eyes:      General: No scleral icterus.        Right eye: No discharge.         Left eye: No discharge.   Neck:      Vascular: No carotid bruit.   Cardiovascular:      Rate and Rhythm: Normal  rate and regular rhythm.      Pulses: Normal pulses.      Heart sounds: Normal heart sounds. No murmur heard.    No friction rub. No gallop.   Pulmonary:      Effort: Pulmonary effort is normal. No respiratory distress.      Breath sounds: Normal breath sounds. No stridor. No wheezing, rhonchi or rales.   Chest:      Chest wall: No tenderness.   Abdominal:      General: Abdomen is flat. Bowel sounds are normal. There is no distension.      Palpations: Abdomen is soft. There is no mass.      Tenderness: There is no abdominal tenderness. There is no right CVA tenderness, left CVA tenderness, guarding or rebound.      Hernia: No hernia is present.   Musculoskeletal:         General: No swelling, tenderness, deformity or signs of injury.      Cervical back: No rigidity or tenderness.      Right lower leg: No edema.      Left lower leg: No edema.   Lymphadenopathy:      Cervical: No cervical adenopathy.   Skin:     Capillary Refill: Capillary refill 2 seconds     Coloration: Skin is not jaundiced or pale.      Findings: No bruising, erythema, lesion or rash.   Neurological:      General: No focal deficit present.      Mental Status: is alert. Mental status is at baseline.      Cranial Nerves: No cranial nerve deficit.      Sensory: No sensory deficit.      Motor: No weakness.      Coordination: Coordination normal.      Gait: Gait normal.      Deep Tendon Reflexes: Reflexes normal.   Psychiatric:         Mood and Affect: Mood normal.         Behavior: Behavior normal.         Thought Content: Thought content normal.         Judgment: Judgment normal.     Assessment/Plan:  Assessment & Plan  History of non-ST elevation myocardial infarction (NSTEMI)    Orders:    carvedilol  (COREG ) 3.125 MG tablet; Take 1 tablet (3.125 mg) by mouth 2 times daily (with meals).    History of coronary artery stent placement         Acute on chronic HFrEF (heart failure with reduced ejection fraction) (CMS-HCC)    Orders:    carvedilol   (COREG ) 3.125 MG tablet; Take 1 tablet (3.125 mg) by mouth 2 times daily (  with meals).    Essential hypertension    Orders:    carvedilol  (COREG ) 3.125 MG tablet; Take 1 tablet (3.125 mg) by mouth 2 times daily (with meals).    Mixed hyperlipidemia         Seeing Cardiology   Has an appointment 12/2023    ntains abnormal data Comprehensive Metabolic Panel  Order: [id number]   Status: Final result       Next appt: None       Dx: Acute on chronic HFrEF (heart failure...    Test Result Released: No (inaccessible in MyChart)    0 Result Notes       1 HM Topic         Component  Ref Range & Units 1 mo ago 9 mo ago 1 yr ago 2 yr ago   Glucose  65 - 99 mg/dL 873 High  885 High  CM  134 High  CM   Comment:               Fasting reference interval     For someone without known diabetes, a glucose  value >125 mg/dL indicates that they may have  diabetes and this should be confirmed with a  follow-up test.      BUN  7 - 25 mg/dL 18 18  19    Creatinine  0.70 - 1.30 mg/dL 9.11 9.02  9.05   EGFR  > OR = 60 mL/min/1.87m2 101 92  96 CM   BUN/Creatinine Ratio  6 - 22 (calc) SEE NOTE: SEE NOTE: CM  NOT APPLICABLE   Comment:    Not Reported: BUN and Creatinine are within     reference range.        Sodium  135 - 146 mmol/L 140 140  139   Potassium  3.5 - 5.3 mmol/L 4.3 4.5  4.6   Chloride  98 - 110 mmol/L 100 104  102   Carbon Dioxide  20 - 32 mmol/L 30 23  22    Calcium   8.6 - 10.3 mg/dL 9.6 9.1  9.6   Total Protein  6.1 - 8.1 g/dL 6.8 6.8 6.9 7.1   Albumin  3.6 - 5.1 g/dL 4.5 4.4 4.3 4.6   Globulin  1.9 - 3.7 g/dL (calc) 2.3 2.4 2.6 2.5   Albumin/Glob Ratio  1.0 - 2.5 (calc) 2.0 1.8 1.7 1.8   Bilirubin, Total  0.2 - 1.2 mg/dL 0.6 0.5 0.5 0.8   Alkaline Phos  35 - 144 U/L 89 82 80 85   AST (SGOT)  10 - 35 U/L 17 19 22 23    ALT (SGPT)  9 - 46 U/L 23 20 23 24    Resulting Agency [facility]              Narrative  Performed by:  [facility]  FASTING:YES    FASTING: YES   Resulting Agency's Comment    Performing Organization Information:      Site ID: [id number]      Name: [facility]      Address: [address] [place], [place] [address]      Director: Debby PARAS McDonald   Specimen Collected: 10/17/23 07:07     Nutrition and healthy food. Exercise. low fat diet. high fiber diet. Take medications as directed. Emergency room instructions were given.  Follow up in 15 days.

## 2023-12-15 ENCOUNTER — Encounter (INDEPENDENT_AMBULATORY_CARE_PROVIDER_SITE_OTHER): Payer: Self-pay | Admitting: Emergency Medicine

## 2023-12-15 ENCOUNTER — Telehealth (INDEPENDENT_AMBULATORY_CARE_PROVIDER_SITE_OTHER): Payer: Self-pay | Admitting: Emergency Medicine

## 2023-12-15 DIAGNOSIS — I10 Essential (primary) hypertension: Secondary | ICD-10-CM

## 2023-12-15 DIAGNOSIS — R0602 Shortness of breath: Secondary | ICD-10-CM

## 2023-12-17 ENCOUNTER — Encounter (INDEPENDENT_AMBULATORY_CARE_PROVIDER_SITE_OTHER): Payer: Self-pay | Admitting: Emergency Medicine

## 2024-01-04 ENCOUNTER — Telehealth (INDEPENDENT_AMBULATORY_CARE_PROVIDER_SITE_OTHER): Payer: Self-pay | Admitting: Emergency Medicine

## 2024-01-04 DIAGNOSIS — I252 Old myocardial infarction: Secondary | ICD-10-CM

## 2024-01-04 DIAGNOSIS — I5023 Acute on chronic systolic (congestive) heart failure: Secondary | ICD-10-CM

## 2024-01-04 DIAGNOSIS — Z955 Presence of coronary angioplasty implant and graft: Secondary | ICD-10-CM

## 2024-01-04 NOTE — Progress Notes (Signed)
EDD

## 2024-01-11 LAB — GLYCOSYLATED HGB(A1C), BLOOD: Hgb A1C: 8.8 % — ABNORMAL HIGH (ref <5.7)

## 2024-01-14 ENCOUNTER — Telehealth (INDEPENDENT_AMBULATORY_CARE_PROVIDER_SITE_OTHER): Admitting: Emergency Medicine

## 2024-01-14 MED ORDER — OZEMPIC (0.25 OR 0.5 MG/DOSE) 2 MG/3ML SC SOPN
0.2500 mg | PEN_INJECTOR | SUBCUTANEOUS | 1 refills | Status: DC
Start: 2024-01-14 — End: 2024-01-14

## 2024-01-14 MED ORDER — OZEMPIC (0.25 OR 0.5 MG/DOSE) 2 MG/3ML SC SOPN
0.2500 mg | PEN_INJECTOR | SUBCUTANEOUS | 1 refills | Status: DC
Start: 2024-01-14 — End: 2024-04-01

## 2024-01-14 NOTE — Progress Notes (Signed)
 Subjective :  Matthew Estes is a 58 year old male who is here for Follow Up    Patient Verification & Telemedicine Consent & Financial Waiver:    1.   Identity: I have verified this patient's identity to be accurate.  2.   Consent: I verify consent has been secured in one of the following methods: (a) obtained written/ online attestation consent (via MyChartVideoVisit pathway), (b) the spoke-side provider has obtained verbal or written consent from patient/surrogate (if this is a provider to provider evaluation), or (c) in all other cases, I have personally obtained verbal consent from the patient/ surrogate (noting all elements below) to perform this voluntary telemedicine evaluation (including obtaining history, performing examination and reviewing data provided by the patient).   The patient/ surrogate has the right to refuse this evaluation.  I have explained risks (including potential loss of confidentiality), benefits, alternatives, and the potential need for subsequent face to face care. Patient/ surrogate understands that there is a risk of medical inaccuracies given that our recommendations will be made based on reported data (and we must therefore assume this information is accurate).  Knowing that there is a risk that this information is not reported accurately, and that the telemedicine video, audio, or data feed may be incomplete, the patient agrees to proceed with evaluation and holds us  harmless knowing these risks.  3.   Healthcare Team: The patient/ surrogate has been notified that other healthcare professionals (including students, residents and Engineer, maintenance) may be involved in this audio-video evaluation.   All laws concerning confidentiality and patient access to medical records and copies of medical records apply to telemedicine.  4.   Privacy: If this is a Restaurant manager, fast food Visit, the patient/ surrogate has received the [facility] [place] Notice of Privacy Practices via E-Checkin process.  For all  other video visit techniques, I have verbally provided the patient/ surrogate with the [place] web link in [place] (https://[email or web address].si.aspx) or Spanish (https://health.LavishToys.ch.aspx).  The patient/ surrogate acknowledges both being provided the NPP link, and has been offered to have the NPP mailed to the patient/ surrogate by [place]  mail.  The patient/ surrogate has voiced understanding an acknowledgement of receipt of this NPP web address.  If the patient/surrogate has elected to receive the NPP via [place]  mail, I verify that the NPP will be sent promptly to the patient/surrogate via [place]  mail.  5.   Capacity: I have reviewed this above verification and consent paragraph with the patient/ surrogate and the patient is capacitated or has a surrogate. If the patient is not capacitated to understand the above, and no surrogate is available, since this is not an emergency evaluation, the visit will be rescheduled until such time that the patient can consent, or the surrogate is available to consent. If this is an emergency evaluation and the patient is not capacitated to understand the above, and no surrogate is available, I am proceeding with this evaluation as this is felt to be an emergency setting and no appropriate specialist is available at the bedside to perform these evaluations.  6.   Financial Waiver: If this is a Restaurant manager, fast food Visit, the patient has been made aware of the financial waiver via E-Checkin process.  For all other video visit techniques, an E-Checkin process is not performed.  As such, I have personally verbally informed the patient/ surrogate that this evaluation will be a billable encounter similar to an in-person clinic visit, and the patient/ surrogate has agreed to pay the fee for services rendered.  If we are billing insurance for the patient's telehealth visit, her out-of-pocket cost will be determined based on her plan and will be billed to her.  The patient/  surrogate has also been informed that if the patient does not have insurance or does not wish to use insurance, [facility] [address] [employer] price for a primary care telehealth visit is $59.00 and specialist telehealth visit is $88.00.  I have further informed the patient/ surrogate that in the event the patient has additional services provided in conjunction with the specialty visit (Ex. Psychotherapy services), those services will be billed at the current rate less a 45% discount.  7.   Intra-State Location: The patient/ surrogate attests to understanding that if the patient accesses these services from a location outside of [place] , that the patient does so at the patient's own risk and initiative and that the patient is ultimately responsible for compliance with any laws or regulations associated with the patient's use.  8.   Specific Use:The patient/ surrogate understands that [facility] makes no representation that materials or servicesdelivered via telecommunication services, or listed on telemedicine websites, are appropriate or available for use in any other location.         Matthew Estes is a 58 YO M who has a telehealth visit to review results. Pt states he was seen by cardiology and had external monitor x 6 days. Pt reports fatigue has resolved.       Issues discussed today:  Results     Review of Systems   Constitutional: Negative.    HENT: Negative.     Eyes: Negative.    Respiratory: Negative.     Cardiovascular: Negative.    Gastrointestinal: Negative.    Endocrine: Negative.    Genitourinary: Negative.    Musculoskeletal: Negative.    Skin: Negative.    Allergic/Immunologic: Negative.    Neurological: Negative.    Hematological: Negative.    Psychiatric/Behavioral: Negative.        albuterol  Inhale 2 puffs by mouth every 6 hours as needed for Wheezing. 8 g 2    aspirin  Take 1 tablet (81 mg) by mouth daily. 100 tablet 3    atorvastatin  Take 1 tablet (40 mg) by mouth daily for 30 days. 30 tablet 5    blood  glucose PHARMACY: Please dispense insurance approved bluetooth enable meter. 1 each 0    carvedilol  Take 1 tablet (3.125 mg) by mouth 2 times daily (with meals). 180 tablet 0    True Metrix Blood Glucose Test 1 strip by Other route every morning (before breakfast). 100 strip 3    glucose blood 1 strip by Other route every morning (before breakfast). 100 strip 11    lancets 1 each by Other route every morning (before breakfast). 100 Lancet. 11    metFORMIN  TAKE 1 TABLET BY MOUTH TWICE A DAY WITH MEALS 180 tablet 3    nitroGLYcerin 1 tablet (0.4 mg) by Sublingual route.      omega-3 acid ethyl esters TAKE TWO CAPSULE BY MOUTH TWICE DAILY FOR CHOLESTEROL      Ozempic  (0.25 or 0.5 MG/DOSE) Inject 0.375 mL (0.25 mg) under the skin every 7 days. 3 mL 1    ticagrelor  Take 1 tablet (90 mg) by mouth every 12 hours. 180 tablet 3     No Known Allergies    Reviewed patients pertinent information related to social history, past medical, past surgical, and family history.     Objective :  Vital signs: There were  no vitals taken for this visit.    Physical Exam  No physical assessment performed as this encounter took place via telehealth.    Assessment/Plan:  Assessment & Plan  History of non-ST elevation myocardial infarction (NSTEMI)    Orders:    semaglutide , 0.25 or 0.5MG /DOSE, (OZEMPIC , 0.25 OR 0.5 MG/DOSE,) 2 MG/3ML injection pen; Inject 0.375 mL (0.25 mg) under the skin every 7 days.  Stable, pt initiated on ozempic , pt educated on medication and potential side effects. Follow up with cardiology.  Type 2 diabetes mellitus with hyperglycemia, without long-term current use of insulin  (CMS-HCC)    Orders:    semaglutide , 0.25 or 0.5MG /DOSE, (OZEMPIC , 0.25 OR 0.5 MG/DOSE,) 2 MG/3ML injection pen; Inject 0.375 mL (0.25 mg) under the skin every 7 days.  Stable on current treatment. ADA diet, exercise recommendations, and ER precautions reviewed.  Nutrition and healthy food. Exercise. low fat diet. high fiber diet. Take  medications as directed. Emergency room instructions were given.  Follow up in 30 days.

## 2024-02-16 ENCOUNTER — Other Ambulatory Visit (INDEPENDENT_AMBULATORY_CARE_PROVIDER_SITE_OTHER): Payer: Self-pay | Admitting: Emergency Medicine

## 2024-02-16 DIAGNOSIS — E119 Type 2 diabetes mellitus without complications: Secondary | ICD-10-CM

## 2024-03-09 ENCOUNTER — Other Ambulatory Visit (INDEPENDENT_AMBULATORY_CARE_PROVIDER_SITE_OTHER): Payer: Self-pay | Admitting: Emergency Medicine

## 2024-03-09 DIAGNOSIS — I252 Old myocardial infarction: Secondary | ICD-10-CM

## 2024-03-09 DIAGNOSIS — I5023 Acute on chronic systolic (congestive) heart failure: Secondary | ICD-10-CM

## 2024-03-09 DIAGNOSIS — I10 Essential (primary) hypertension: Secondary | ICD-10-CM

## 2024-03-11 NOTE — Telephone Encounter (Signed)
 refill

## 2024-03-28 ENCOUNTER — Other Ambulatory Visit (INDEPENDENT_AMBULATORY_CARE_PROVIDER_SITE_OTHER): Payer: Self-pay | Admitting: Emergency Medicine

## 2024-03-28 DIAGNOSIS — E119 Type 2 diabetes mellitus without complications: Secondary | ICD-10-CM

## 2024-04-01 ENCOUNTER — Encounter (INDEPENDENT_AMBULATORY_CARE_PROVIDER_SITE_OTHER): Payer: Self-pay | Admitting: Emergency Medicine

## 2024-04-01 ENCOUNTER — Ambulatory Visit (INDEPENDENT_AMBULATORY_CARE_PROVIDER_SITE_OTHER): Admitting: Emergency Medicine

## 2024-04-01 VITALS — BP 150/85 | HR 82 | Temp 98.0°F | Resp 16 | Ht 68.0 in | Wt 180.2 lb

## 2024-04-01 MED ORDER — JANUVIA 100 MG OR TABS
100.0000 mg | ORAL_TABLET | Freq: Every day | ORAL | 3 refills | Status: AC
Start: 2024-04-01 — End: ?

## 2024-04-01 MED ORDER — LISINOPRIL 20 MG OR TABS
20.0000 mg | ORAL_TABLET | Freq: Every day | ORAL | 1 refills | Status: AC
Start: 2024-04-01 — End: ?

## 2024-04-01 MED ORDER — CARVEDILOL 6.25 MG OR TABS
6.2500 mg | ORAL_TABLET | Freq: Two times a day (BID) | ORAL | 1 refills | Status: AC
Start: 2024-04-01 — End: 2024-06-30

## 2024-04-01 MED ORDER — EMPAGLIFLOZIN 25 MG PO TABS
25.0000 mg | ORAL_TABLET | Freq: Every day | ORAL | 1 refills | Status: AC
Start: 2024-04-01 — End: 2024-06-30

## 2024-04-01 NOTE — Progress Notes (Signed)
 Subjective :  Matthew Estes is a 58 year old male who is here for Physical (58 y/o male presents to office for physical and clearance to return to work. )      HPI    Issues discussed today:  Follow up  Seen by Cardiology , dr keary  Review of Systems   Review of Systems   Constitutional: Negative for activity change, appetite change, fatigue, fever and unexpected weight change.   HENT: Negative for congestion, facial swelling, hearing loss, rhinorrhea, sinus pressure, sore throat, trouble swallowing and voice change.    Eyes: Negative for photophobia, pain, discharge, redness, itching and visual disturbance.   Respiratory: Negative for apnea, cough, choking, chest tightness, shortness of breath, wheezing and stridor.    Cardiovascular: Negative for chest pain, palpitations and leg swelling.   Gastrointestinal: Negative for abdominal distention, abdominal pain, anal bleeding, blood in stool, constipation, diarrhea, nausea, rectal pain and vomiting.   Endocrine: Negative for cold intolerance, heat intolerance, polydipsia, polyphagia and polyuria.   Genitourinary: Negative for difficulty urinating, dysuria, enuresis, flank pain, frequency, genital sores and hematuria.   Musculoskeletal: Negative for arthralgias, back pain, gait problem, joint swelling, myalgias, neck pain and neck stiffness.   Skin: Negative for pallor, rash and wound.   Allergic/Immunologic: Negative for environmental allergies, food allergies and immunocompromised state.   Neurological: Negative for dizziness, tremors, seizures, syncope, facial asymmetry, speech difficulty, weakness, light-headedness, numbness and headaches.   Hematological: Negative for adenopathy. Does not bruise/bleed easily.   Psychiatric/Behavioral: Negative for agitation, behavioral problems, confusion, decreased concentration, dysphoric mood, hallucinations, self-injury, sleep disturbance and suicidal ideas. The patient is not nervous/anxious and is not hyperactive.        albuterol  Inhale 2 puffs by mouth every 6 hours as needed for Wheezing. 8 g 2    aspirin  Take 1 tablet (81 mg) by mouth daily. 100 tablet 3    atorvastatin  Take 1 tablet (40 mg) by mouth daily for 30 days. 30 tablet 5    blood glucose PHARMACY: Please dispense insurance approved bluetooth enable meter. 1 each 0    carvedilol  TAKE 1 TABLET BY MOUTH TWICE A DAY WITH MEALS 180 tablet 2    True Metrix Blood Glucose Test 1 strip by Other route every morning (before breakfast). 100 strip 3    glucose blood 1 strip by Other route every morning (before breakfast). 100 strip 11    lancets 1 each by Other route every morning (before breakfast). 100 Lancet. 11    metFORMIN  TAKE 1 TABLET BY MOUTH TWICE A DAY WITH MEALS 180 tablet 3    nitroGLYcerin 1 tablet (0.4 mg) by Sublingual route.      omega-3 acid ethyl esters TAKE TWO CAPSULE BY MOUTH TWICE DAILY FOR CHOLESTEROL      pioglitazone  TAKE 1 TABLET BY MOUTH EVERY DAY 90 tablet 3    Ozempic  (0.25 or 0.5 MG/DOSE) Inject 0.375 mL (0.25 mg) under the skin every 7 days. 3 mL 1    ticagrelor  Take 1 tablet (90 mg) by mouth every 12 hours. 180 tablet 3     Allergies[1]    Reviewed patients pertinent information related to social history, past medical, past surgical, and family history.     Objective :  Vital signs: BP (!) 162/90 (BP Location: Left arm, BP Patient Position: Sitting)   Pulse 82   Temp 98 F (36.7 C) (Temporal)   Resp 16   Ht 5' 8 (1.727 m)   Wt 81.7 kg (180  lb 3.2 oz)   SpO2 99%   BMI 27.40 kg/m     Physical Exam   Physical Exam  Constitutional:       General: not in acute distress.     Appearance: Normal appearance.  is normal weight. is not ill-appearing, toxic-appearing or diaphoretic.   HENT:      Head: Normocephalic and atraumatic.   Eyes:      General: No scleral icterus.        Right eye: No discharge.         Left eye: No discharge.   Neck:      Vascular: No carotid bruit.   Cardiovascular:      Rate and Rhythm: Normal rate and regular rhythm.       Pulses: Normal pulses.      Heart sounds: Normal heart sounds. No murmur heard.    No friction rub. No gallop.   Pulmonary:      Effort: Pulmonary effort is normal. No respiratory distress.      Breath sounds: Normal breath sounds. No stridor. No wheezing, rhonchi or rales.   Chest:      Chest wall: No tenderness.   Abdominal:      General: Abdomen is flat. Bowel sounds are normal. There is no distension.      Palpations: Abdomen is soft. There is no mass.      Tenderness: There is no abdominal tenderness. There is no right CVA tenderness, left CVA tenderness, guarding or rebound.      Hernia: No hernia is present.   Musculoskeletal:         General: No swelling, tenderness, deformity or signs of injury.      Cervical back: No rigidity or tenderness.      Right lower leg: No edema.      Left lower leg: No edema.   Lymphadenopathy:      Cervical: No cervical adenopathy.   Skin:     Capillary Refill: Capillary refill 2 seconds     Coloration: Skin is not jaundiced or pale.      Findings: No bruising, erythema, lesion or rash.   Neurological:      General: No focal deficit present.      Mental Status: is alert. Mental status is at baseline.      Cranial Nerves: No cranial nerve deficit.      Sensory: No sensory deficit.      Motor: No weakness.      Coordination: Coordination normal.      Gait: Gait normal.      Deep Tendon Reflexes: Reflexes normal.   Psychiatric:         Mood and Affect: Mood normal.         Behavior: Behavior normal.         Thought Content: Thought content normal.         Judgment: Judgment normal.     Assessment/Plan:  Assessment & Plan  Dietary counseling    Orders:    empagliflozin (JARDIANCE) 25 mg tablet; Take 1 tablet (25 mg) by mouth daily.    Essential hypertension    Orders:    lisinopril (PRINIVIL, ZESTRIL) 20 MG tablet; Take 1 tablet (20 mg) by mouth daily.    carvedilol  (COREG ) 6.25 MG tablet; Take 1 tablet (6.25 mg) by mouth 2 times daily (with meals).  ntains abnormal data  Glycosylated Hgb(A1C), Blood  Order: [id number]   Status: Final result       Next  appt: None       Dx: Controlled type 2 diabetes mellitus w...    Test Result Released: No (inaccessible in MyChart)    0 Result Notes       1 HM Topic           Component  Ref Range & Units 2 mo ago  (01/10/24) 7 mo ago  (08/13/23) 1 yr ago  (02/14/23) 2 yr ago  (01/29/22) 2 yr ago  (09/13/21) 3 yr ago  (02/08/21)   Hgb A1C  <5.7 % 8.8 High  8.5 High  R, CM 7.8 High  R, CM 6.6 High  R, CM 6.8 High  R, CM 6.4 High  R, CM   Comment: For someone without known diabetes, a hemoglobin A1c  value of 6.5% or greater indicates that they may have  diabetes and this should be confirmed with a follow-up  test.     For someone with known diabetes, a value <7% indicates  that their diabetes is well controlled and a value  greater than or equal to 7% indicates suboptimal  control. A1c targets should be individualized based on  duration of diabetes, age, comorbid conditions, and  other considerations.     Currently, no consensus exists regarding use of  hemoglobin A1c for diagnosis of diabetes for children.      Resulting Agency [facility]              Narrative  Performed by: [facility]  FASTING:YES    FASTING: YES   Resulting Agency's Comment    Performing Organization Information:      Site ID: [id number]      Name: [facility]      Address: [address] [place], [place] [address]      Director: Debby PARAS McDonald   Specimen Collected: 01/10/24 12:48     Diabetes mellitus with no complication    Orders:    sitaGLIPtin  (JANUVIA ) 100 mg tablet; Take 1 tablet (100 mg) by mouth daily.    History of non-ST elevation myocardial infarction (NSTEMI)         Nutrition and healthy food. Exercise. low fat diet. high fiber diet. Take medications as directed. Emergency room instructions were given.   Follow up in 30 days.         [1] No Known Allergies

## 2024-04-29 ENCOUNTER — Other Ambulatory Visit (HOSPITAL_BASED_OUTPATIENT_CLINIC_OR_DEPARTMENT_OTHER): Payer: Self-pay

## 2024-04-29 ENCOUNTER — Encounter: Payer: Self-pay | Admitting: Internal Medicine

## 2024-04-29 ENCOUNTER — Ambulatory Visit (INDEPENDENT_AMBULATORY_CARE_PROVIDER_SITE_OTHER): Admitting: Internal Medicine

## 2024-04-29 ENCOUNTER — Ambulatory Visit (INDEPENDENT_AMBULATORY_CARE_PROVIDER_SITE_OTHER): Admitting: Emergency Medicine

## 2024-04-29 VITALS — BP 156/90 | HR 55 | Temp 97.8°F | Resp 16 | Ht 68.0 in | Wt 235.5 lb

## 2024-04-29 DIAGNOSIS — I10 Essential (primary) hypertension: Secondary | ICD-10-CM | POA: Diagnosis not present

## 2024-04-29 DIAGNOSIS — E782 Mixed hyperlipidemia: Secondary | ICD-10-CM | POA: Diagnosis not present

## 2024-04-29 DIAGNOSIS — E119 Type 2 diabetes mellitus without complications: Secondary | ICD-10-CM

## 2024-04-29 DIAGNOSIS — Z7984 Long term (current) use of oral hypoglycemic drugs: Secondary | ICD-10-CM | POA: Diagnosis not present

## 2024-04-29 LAB — HEMOGLOBIN A1C: Hgb A1c MFr Bld: 6.8 % — ABNORMAL HIGH (ref 4.6–6.5)

## 2024-04-29 MED ORDER — ATORVASTATIN CALCIUM 20 MG PO TABS: 20.0000 mg | ORAL_TABLET | Freq: Every day | ORAL | 1 refills | Status: AC

## 2024-04-29 MED ORDER — AMLODIPINE BESYLATE 5 MG PO TABS: 5.0000 mg | ORAL_TABLET | Freq: Every day | ORAL | 1 refills | Status: AC

## 2024-04-29 MED ORDER — COMIRNATY 30 MCG/0.3ML IM SUSY
0.3000 mL | PREFILLED_SYRINGE | Freq: Once | INTRAMUSCULAR | 0 refills | Status: AC
  Filled 2024-04-29: qty 0.3, 1d supply, fill #0

## 2024-04-29 MED ORDER — METFORMIN HCL 850 MG PO TABS: 850.0000 mg | ORAL_TABLET | Freq: Two times a day (BID) | ORAL | 1 refills | Status: AC

## 2024-04-29 NOTE — Progress Notes (Signed)
 "  Subjective:    Patient ID: Bryan Shaw, male    DOB: 07/02/1966, 58 y.o.   MRN: [id number]  DOS:  04/29/2024 Follow-up, here w/ his wife Discussed the use of AI scribe software for clinical note transcription with the patient, who gave verbal consent to proceed.  History of Present Illness  Hypertension - Previously taking amlodipine  every other day, currently out of medication - Does not frequently monitor blood pressure at home  Diabetes mellitus type 2 - Takes metformin  for glycemic control - Does not regularly check blood glucose levels at home  Hyperlipidemia - Takes atorvastatin  every other day  Dietary habits and weight management - Diet includes large portions and unhealthy foods, contributing to weight gain - Adds honey to tea  Physical activity - Recently started attending the gym to increase physical activity   Wt Readings from Last 3 Encounters:  04/29/24 235 lb 8 oz (106.8 kg)  10/30/23 234 lb 8 oz (106.4 kg)  02/28/23 220 lb (99.8 kg)     Review of Systems See above   Past Medical History:  Diagnosis Date   Angio-edema    Diabetes mellitus, type 2 (HCC)    on meds   Essential hypertension 12/31/2019   on meds   GERD (gastroesophageal reflux disease)    on meds   Hyperlipidemia    on meds   Positive PPD 1999   INH x 6 months   PPD negative 2001   Urticaria     Past Surgical History:  Procedure Laterality Date   COLONOSCOPY  2019   HD-MAC-Plenvu(exc)-hems/fecal matter    Current Outpatient Medications  Medication Instructions   amLODipine  (NORVASC ) 5 mg, Oral, Daily   atorvastatin  (LIPITOR) 20 mg, Oral, Daily at bedtime   cetirizine  (ZYRTEC ) 10 mg, Oral, 2 times daily   COVID-19 mRNA vaccine, Pfizer, (COMIRNATY ) syringe 0.3 mLs, Intramuscular,  Once   metFORMIN  (GLUCOPHAGE ) 850 mg, Oral, 2 times daily with meals   omeprazole  (PRILOSEC) 20 mg, Oral, Daily       Objective:   Physical Exam BP (!) 156/90   Pulse (!) 55    Temp 97.8 F (36.6 C) (Oral)   Resp 16   Ht 5' 8 (1.727 m)   Wt 235 lb 8 oz (106.8 kg)   SpO2 97%   BMI 35.81 kg/m  General:   Well developed, NAD, BMI noted. HEENT:  Normocephalic . Face symmetric, atraumatic Lungs:  CTA B Normal respiratory effort, no intercostal retractions, no accessory muscle use. Heart: RRR,  no murmur.  Lower extremities: no pretibial edema bilaterally  Skin: Not pale. Not jaundice Neurologic:  alert & oriented X3.  Speech normal, gait appropriate for age and unassisted Psych--  Cognition and judgment appear intact.  Cooperative with normal attention span and concentration.  Behavior appropriate. No anxious or depressed appearing.      Assessment   Assessment DM Dx/2022 HTN (started med 10/2019) High chol GERD Multiple lipomas Angioedema (allergist eval 2023, blood test neg, sxs self resolved, no clear etiology, on meds prn) PPD +1999, s/p INH. PPD (-) 2001  Assessment & Plan DM type II. Managed with metformin .  Does not check ambulatory CBGs. Emphasized portion control and decrease carbohydrates. Continue metformin  , check A1c, further advised for results. HTN: Previously on amlodipine  every other day, then he ran out, BP is slightly elevated today. Plan: Refill amlodipine , recommend to take 1 tablet daily, monitor BPs.  See instructions. High cholesterol: Was on Lipitor every other day, last  LDL increased, cardiovascular risk at 10 years remains elevated at 18%. Plan: Take atorvastatin  as prescribed daily. General Health Maintenance Had a flu shot, benefits of COVID vaccines discussed, he is more inclined to proceed at the pharmacy. RTC 4 months     "

## 2024-04-29 NOTE — Patient Instructions (Addendum)
 GO TO THE LAB :  Get the blood work    Then, go to the front desk for the checkout Please make an appointment for a checkup in 4 months   start checking your blood pressure regularly Blood pressure goal:  between 110/65 and  135/85. If it is consistently higher or lower, let me know   Continue metformin  Take amlodipine  5 mg every day Take atorvastatin  20 mg every day

## 2024-04-30 ENCOUNTER — Ambulatory Visit: Payer: Self-pay | Admitting: Internal Medicine

## 2024-04-30 NOTE — Assessment & Plan Note (Signed)
 DM type II. Managed with metformin .  Does not check ambulatory CBGs. Emphasized portion control and decrease carbohydrates. Continue metformin  , check A1c, further advised for results. HTN: Previously on amlodipine  every other day, then he ran out, BP is slightly elevated today. Plan: Refill amlodipine , recommend to take 1 tablet daily, monitor BPs.  See instructions. High cholesterol: Was on Lipitor every other day, last LDL increased, cardiovascular risk at 10 years remains elevated at 18%. Plan: Take atorvastatin  as prescribed daily. General Health Maintenance Had a flu shot, benefits of COVID vaccines discussed, he is more inclined to proceed at the pharmacy. RTC 4 months

## 2024-05-06 ENCOUNTER — Ambulatory Visit: Admitting: Internal Medicine

## 2024-05-07 ENCOUNTER — Encounter: Payer: Self-pay | Admitting: Internal Medicine

## 2024-09-02 ENCOUNTER — Ambulatory Visit: Admitting: Internal Medicine

## 2024-11-04 ENCOUNTER — Encounter: Admitting: Internal Medicine
